# Patient Record
Sex: Female | Born: 1985 | Race: White | Hispanic: No | Marital: Married | Smoking: Never smoker
Health system: Southern US, Community
[De-identification: ages and names within clinical notes are randomized; demographics above are authoritative.]

## PROBLEM LIST (undated history)

## (undated) DIAGNOSIS — F53 Postpartum depression: Secondary | ICD-10-CM

## (undated) DIAGNOSIS — O409XX0 Polyhydramnios, unspecified trimester, not applicable or unspecified: Secondary | ICD-10-CM

## (undated) DIAGNOSIS — O24419 Gestational diabetes mellitus in pregnancy, unspecified control: Secondary | ICD-10-CM

## (undated) DIAGNOSIS — F909 Attention-deficit hyperactivity disorder, unspecified type: Secondary | ICD-10-CM

## (undated) DIAGNOSIS — D649 Anemia, unspecified: Secondary | ICD-10-CM

## (undated) DIAGNOSIS — R87629 Unspecified abnormal cytological findings in specimens from vagina: Secondary | ICD-10-CM

## (undated) HISTORY — DX: Postpartum depression: F53.0

## (undated) HISTORY — PX: WISDOM TOOTH EXTRACTION: SHX21

## (undated) HISTORY — DX: Unspecified abnormal cytological findings in specimens from vagina: R87.629

## (undated) HISTORY — DX: Polyhydramnios, unspecified trimester, not applicable or unspecified: O40.9XX0

## (undated) HISTORY — DX: Attention-deficit hyperactivity disorder, unspecified type: F90.9

## (undated) HISTORY — PX: BREAST REDUCTION SURGERY: SHX8

## (undated) HISTORY — PX: BREAST SURGERY: SHX581

## (undated) HISTORY — DX: Anemia, unspecified: D64.9

## (undated) HISTORY — PX: COLPOSCOPY: SHX161

## (undated) HISTORY — PX: LEEP: SHX91

## (undated) HISTORY — DX: Gestational diabetes mellitus in pregnancy, unspecified control: O24.419

---

## 2015-05-16 DIAGNOSIS — F909 Attention-deficit hyperactivity disorder, unspecified type: Secondary | ICD-10-CM | POA: Insufficient documentation

## 2017-08-20 DIAGNOSIS — Z8279 Family history of other congenital malformations, deformations and chromosomal abnormalities: Secondary | ICD-10-CM | POA: Insufficient documentation

## 2017-08-20 DIAGNOSIS — Z9889 Other specified postprocedural states: Secondary | ICD-10-CM | POA: Insufficient documentation

## 2018-05-29 DIAGNOSIS — F419 Anxiety disorder, unspecified: Secondary | ICD-10-CM | POA: Insufficient documentation

## 2019-11-10 LAB — RESULTS CONSOLE HPV: CHL HPV: POSITIVE

## 2019-11-10 LAB — HM PAP SMEAR: HM Pap smear: ABNORMAL

## 2019-11-23 DIAGNOSIS — R87619 Unspecified abnormal cytological findings in specimens from cervix uteri: Secondary | ICD-10-CM | POA: Insufficient documentation

## 2020-07-17 LAB — HM PAP SMEAR: HM Pap smear: NORMAL

## 2020-07-17 LAB — RESULTS CONSOLE HPV: CHL HPV: NEGATIVE

## 2021-03-28 DIAGNOSIS — Z8616 Personal history of COVID-19: Secondary | ICD-10-CM

## 2021-03-28 HISTORY — DX: Personal history of COVID-19: Z86.16

## 2021-05-08 ENCOUNTER — Encounter: Payer: Self-pay | Admitting: *Deleted

## 2021-05-16 ENCOUNTER — Encounter: Payer: Self-pay | Admitting: Obstetrics and Gynecology

## 2021-05-16 NOTE — Progress Notes (Signed)
Pt will come at 12 weeks for Panorama/Horizon Pt states in previous pregnancy she was "borderline" when she did 1 hr gtt and baby was measuring big so pt had to do 3 hr gtt (passed 2/3 of blood draws from 3 hr gtt) and previous doctor's office did not diagnose her with gestational diabetes

## 2021-05-17 ENCOUNTER — Encounter: Payer: Self-pay | Admitting: Obstetrics and Gynecology

## 2021-05-17 ENCOUNTER — Other Ambulatory Visit: Payer: Self-pay | Admitting: *Deleted

## 2021-05-17 ENCOUNTER — Other Ambulatory Visit: Payer: Self-pay

## 2021-05-17 ENCOUNTER — Ambulatory Visit (INDEPENDENT_AMBULATORY_CARE_PROVIDER_SITE_OTHER): Payer: 59 | Admitting: Obstetrics and Gynecology

## 2021-05-17 ENCOUNTER — Ambulatory Visit (INDEPENDENT_AMBULATORY_CARE_PROVIDER_SITE_OTHER): Payer: 59

## 2021-05-17 VITALS — BP 110/71 | HR 94 | Ht 66.0 in | Wt 170.0 lb

## 2021-05-17 DIAGNOSIS — O3680X0 Pregnancy with inconclusive fetal viability, not applicable or unspecified: Secondary | ICD-10-CM

## 2021-05-17 DIAGNOSIS — Z3A00 Weeks of gestation of pregnancy not specified: Secondary | ICD-10-CM | POA: Diagnosis not present

## 2021-05-17 DIAGNOSIS — R87619 Unspecified abnormal cytological findings in specimens from cervix uteri: Secondary | ICD-10-CM | POA: Diagnosis not present

## 2021-05-17 DIAGNOSIS — O0990 Supervision of high risk pregnancy, unspecified, unspecified trimester: Secondary | ICD-10-CM | POA: Insufficient documentation

## 2021-05-17 DIAGNOSIS — O409XX0 Polyhydramnios, unspecified trimester, not applicable or unspecified: Secondary | ICD-10-CM | POA: Insufficient documentation

## 2021-05-17 DIAGNOSIS — Z3687 Encounter for antenatal screening for uncertain dates: Secondary | ICD-10-CM

## 2021-05-17 DIAGNOSIS — O09529 Supervision of elderly multigravida, unspecified trimester: Secondary | ICD-10-CM | POA: Insufficient documentation

## 2021-05-17 LAB — ANTIBODY SCREEN: Antibody Screen: NOT DETECTED

## 2021-05-17 LAB — HCG, QUANTITATIVE, PREGNANCY: HCG, Total, QN: 20745 m[IU]/mL

## 2021-05-17 LAB — RH TYPE

## 2021-05-17 NOTE — Progress Notes (Signed)
   GYNECOLOGY OFFICE VISIT NOTE  History:   Taylor Yu is a 35 y.o. G2P1001 here today for initially a new OB visit but bedside US shows potentially only a small gestational sac. I was at bedside while Mariel Aloe, RN performed the bedside US.   She has no symptoms to suggest SAB. She denies any abnormal vaginal discharge, bleeding, pelvic pain or other concerns.     Past Medical History:  Diagnosis Date   ADHD    Gestational diabetes    Polyhydramnios affecting pregnancy    Vaginal Pap smear, abnormal     Past Surgical History:  Procedure Laterality Date   BREAST REDUCTION SURGERY     CESAREAN SECTION     COLPOSCOPY     LEEP     WISDOM TOOTH EXTRACTION      The following portions of the patient's history were reviewed and updated as appropriate: allergies, current medications, past family history, past medical history, past social history, past surgical history and problem list.   Review of Systems:  Pertinent items noted in HPI and remainder of comprehensive ROS otherwise negative.  Physical Exam:  BP 110/71   Pulse 94   Ht 5\' 6"  (1.676 m)   Wt 170 lb (77.1 kg)   LMP 03/05/2021   BMI 27.44 kg/m  CONSTITUTIONAL: Well-developed, well-nourished female in no acute distress.  HEENT:  Normocephalic, atraumatic. External right and left ear normal. No scleral icterus.   SKIN: No rash noted. Not diaphoretic. No erythema. No pallor. MUSCULOSKELETAL: Normal range of motion. No edema noted. NEUROLOGIC: Alert and oriented to person, place, and time. Normal muscle tone coordination. No cranial nerve deficit noted. PSYCHIATRIC: Normal mood and affect. Normal behavior. Normal judgment and thought content.  CARDIOVASCULAR: Normal heart rate noted RESPIRATORY: Effort and breath sounds normal, no problems with respiration noted ABDOMEN: No masses noted. No other overt distention noted.    PELVIC: Deferred  Labs and Imaging No results found for this or any previous visit (from  the past 168 hour(s)). No results found.    Assessment and Plan:    1. Pregnancy with uncertain fetal viability, single or unspecified fetus - Discussed at this time viability is unclear. Reviewed could be too early to tell vs very early SAB. We will check 03/07/2021 and serial betas and then make a management plan from that point forward.  - Discussed options if it is a miscarriage: expectant management, misoprostol and D&E.   - [place] bedside; Future - US PELVIC COMPLETE WITH TRANSVAGINAL; Future - Rh Type - Antibody screen - B-HCG Quant  2. 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 Follow up pap wnl.  - Will have her return in a month for possible new OB vs SAB f/u and repap.    Routine preventative health maintenance measures emphasized. Please refer to After Visit Summary for other counseling recommendations.   Return in about 1 month (around 06/16/2021).   - we will do annual/fu at this point vs new OB visit.   I spent 32 minutes dedicated to the care of this patient including pre-visit review of records, face to face time with the patient discussing her conditions and treatments and post visit orders.    08/16/2021, MD, FACOG Obstetrician & Gynecologist, [facility] for [facility], [facility]

## 2021-05-17 NOTE — Addendum Note (Signed)
Addended by: Kathie Dike on: 05/17/2021 10:44 AM   Modules accepted: Orders

## 2021-05-18 ENCOUNTER — Telehealth: Payer: Self-pay

## 2021-05-18 NOTE — Telephone Encounter (Signed)
Pt is to go to [facility] (due to the office being closed) on Saturday 05/19/21 for STAT BHCG blood draw per Dr.Duncan. HCG drawn on 05/17/21 and needs 48 hour follow up HCG. Pt called office to get address to MAU. Address given over the phone and sent through MyChart message. Pt is aware to go to [facility]U on 05/18/21 and that a provider will reach out to her with results and plan of care.

## 2021-05-19 ENCOUNTER — Other Ambulatory Visit: Payer: Self-pay

## 2021-05-19 ENCOUNTER — Telehealth: Payer: Self-pay | Admitting: Advanced Practice Midwife

## 2021-05-19 ENCOUNTER — Inpatient Hospital Stay (HOSPITAL_COMMUNITY)
Admission: AD | Admit: 2021-05-19 | Discharge: 2021-05-19 | Disposition: A | Discharge status: Home / Self Care | Payer: 59 | Attending: Obstetrics and Gynecology | Admitting: Obstetrics and Gynecology

## 2021-05-19 DIAGNOSIS — Z3A01 Less than 8 weeks gestation of pregnancy: Secondary | ICD-10-CM

## 2021-05-19 DIAGNOSIS — O3680X0 Pregnancy with inconclusive fetal viability, not applicable or unspecified: Secondary | ICD-10-CM | POA: Diagnosis present

## 2021-05-19 DIAGNOSIS — Z3A10 10 weeks gestation of pregnancy: Secondary | ICD-10-CM | POA: Insufficient documentation

## 2021-05-19 LAB — HCG, QUANTITATIVE, PREGNANCY: hCG, Beta Chain, Quant, S: 34917 m[IU]/mL — ABNORMAL HIGH (ref <5)

## 2021-05-19 NOTE — MAU Note (Signed)
Pt reports to mau for follow up lab work.  Pt denies vag bleeding or pain at this time.

## 2021-05-19 NOTE — MAU Provider Note (Signed)
History    Event Date/Time   First Provider Initiated Contact with Patient 05/19/21 1135      Chief Complaint:  Follow-up   Taylor Yu is  35 y.o. G2P1001 Patient's last menstrual period was 03/05/2021.[name] Patient is here for follow up of quantitative HCG and ongoing surveillance of pregnancy status.  She is [redacted]w[redacted]d weeks gestation by LMP.  Was seen at [facility] 05/17/21 for NOB. Viability US showed no GS or fetal pole. On review of images with Dr. Donavan Foil there is possibly a collapsed gestational sac. Denies any Hx vaginal bleeding. Slight low abd cramping.  Since her last visit, the patient is without new complaint.     ROS Abdominal Pain: Denies Vaginal bleeding: none.   Passage of clots or tissue: None Dizziness: Denies  Rh +   Her previous Quantitative HCG values are: Results for Taylor Yu, SAHM" (MRN [id number]) as of 05/19/2021 13:24  Ref. Range 05/17/2021 11:21  HCG, Total, QN Latest Units: mIU/mL 20,745    Physical Exam   Patient Vitals for the past 24 hrs:  BP Temp Temp src Pulse Resp SpO2  05/19/21 1127 115/73 98.3 F (36.8 C) Oral 93 15 99 %   Constitutional: Well-nourished female in no apparent distress. No pallor Neuro: Alert and oriented 4 Cardiovascular: Normal rate Respiratory: Normal effort and rate Abdomen: Soft, nontender Gynecological Exam: examination not indicated  Labs: Results for orders placed or performed during the hospital encounter of 05/19/21 (from the past 24 hour(s))  hCG, quantitative, pregnancy   Collection Time: 05/19/21 11:29 AM  Result Value Ref Range   hCG, Beta Chain, Quant, S 34,917 (H) <5 mIU/mL    Ultrasound Studies:   US OB LESS THAN 14 WEEKS WITH OB TRANSVAGINAL  Result Date: 05/17/2021 CLINICAL DATA:  Fetal viability. EXAM: OBSTETRIC <14 WK [place] AND TRANSVAGINAL OB US TECHNIQUE: Both transabdominal and transvaginal ultrasound examinations were performed for complete evaluation of the gestation as well as the maternal  uterus, adnexal regions, and pelvic cul-de-sac. Transvaginal technique was performed to assess early pregnancy. COMPARISON:  None. FINDINGS: Intrauterine gestational sac: None Yolk sac:  Not Visualized. Embryo:  Not Visualized. Cardiac Activity: Not Visualized. Subchorionic hemorrhage:  None visualized. Maternal uterus/adnexae: Probable corpus luteum cyst seen in right ovary. Left ovary is unremarkable. Complex echogenic material is noted within the endometrial space which may represent decidual reaction no free fluid is noted. IMPRESSION: No intrauterine gestational sac, yolk sac, fetal pole, or cardiac activity visualized. Differential considerations include intrauterine gestation too early to be sonographically visualized, spontaneous abortion, or ectopic pregnancy. Consider follow-up ultrasound in 14 days and serial quantitative beta HCG follow-up. Electronically Signed   By: Lupita Raider M.D.   On: 05/17/2021 11:55    MAU course/MDM: Quantitative hCG ordered  Pregnancy of unknown anatomic location with low-normal rise (66%) in Quant and hemodynamically stable.  Although ultrasound report states no gestational sac, there is complex echogenic material within the endometrial space which may be a collapsed gestational sac.  Although hCG levels are far above the discriminatory zone, the pt does actually have something in her uterus making ectopic less likely.   Assessment: Pregnancy of unknown anatomic location with normal rise in Quant but high suspicion for failed pregnancy due to high quants without fetal pole on ultrasound.  Plan: Discharge home in stable condition. SAB and ectopic precautions 48-hour quant not indicated per Dr. Donavan Foil.  Repeat ultrasound 2 weeks after first ultrasound.  In basket message sent to [id number]. Allergies as  of 05/19/2021       Reactions   Meperidine Itching, Rash        Medication List     ASK your doctor about these medications     amphetamine-dextroamphetamine 30 MG 24 hr capsule Commonly known as: ADDERALL XR Take by mouth.   amphetamine-dextroamphetamine 10 MG tablet Commonly known as: ADDERALL Take by mouth.   FLUoxetine 20 MG capsule Commonly known as: PROZAC Take 1 capsule by mouth daily.        Hazel Dell, [place], CNM 05/19/2021, 1:22 PM  2/3

## 2021-05-19 NOTE — Telephone Encounter (Signed)
~  66 % rise in hCG. Possible collapsed GS on [place] 05/17/21. Per Dr. Donavan Foil will repeat US 05/30/21. SAB and ectopic precautions.

## 2021-05-30 ENCOUNTER — Ambulatory Visit (INDEPENDENT_AMBULATORY_CARE_PROVIDER_SITE_OTHER): Payer: 59

## 2021-05-30 ENCOUNTER — Telehealth: Payer: Self-pay | Admitting: Advanced Practice Midwife

## 2021-05-30 ENCOUNTER — Other Ambulatory Visit: Payer: Self-pay | Admitting: Advanced Practice Midwife

## 2021-05-30 ENCOUNTER — Other Ambulatory Visit: Payer: Self-pay

## 2021-05-30 DIAGNOSIS — Z3A00 Weeks of gestation of pregnancy not specified: Secondary | ICD-10-CM | POA: Diagnosis not present

## 2021-05-30 DIAGNOSIS — Z3687 Encounter for antenatal screening for uncertain dates: Secondary | ICD-10-CM | POA: Diagnosis not present

## 2021-05-30 DIAGNOSIS — O3680X0 Pregnancy with inconclusive fetal viability, not applicable or unspecified: Secondary | ICD-10-CM | POA: Diagnosis not present

## 2021-05-30 NOTE — Telephone Encounter (Signed)
Called pt to discuss [place] results. This is F/U from [place] 05/17/21 showing Pregnancy of unknown anatomic location with low-normal rise (66%) in Quant. Although ultrasound report states no gestational sac, there was complex echogenic material within the endometrial space which appeared to be possible collapsed gestational sac.  Although hCG levels are far above the discriminatory zone, the pt appeared to actually have something in her uterus making ectopic less likely. Consulted w/ Dr. Donavan Foil who agreed and recommended F/U US 05/30/21.   Quants  05/17/21: 37,169 05/19/21: 67,893  Today's [place] is concerning for possible molar pregnancy.   Maternal uterus/adnexae: No adnexal mass or free fluid. Heterogeneous appearance of the endometrium with irregular cystic spaces throughout the endometrium. Endometrial thickness approximately 13 mm.   IMPRESSION: No intrauterine gestational sac. Endometrium is heterogeneous with multiple irregular cystic spaces throughout the endometrium. This does not appear masslike and likely reflects hemorrhage. However, recommend follow-up quantitative beta HCG and ultrasounds to exclude possibility of retained products or gestational trophoblastic neoplasia.  Consulted w/ Dr. Alysia Penna. Pt needs D/C and to follow quants all the way down to <5.  Discussed concerns for molar pregnancy w/ pt and need to D/C for confirmation of Dx and Tx. Emphasized extreme importance of following quants all the way down because of small but serious risk of malignant changes. Addressed pt's questions. In-basket message sent to [facility] to scheduled D&C ASAP w/ any surgeon. Pt asked to F/U with Dorathy Kinsman, CNM since we have talked to most. Will need to determine F/U based on results. I am happy to F/U with her at the office after consultation w/ attending. Offered pt consultation appt w/ physician prior to [facility] but she declined.   Katrinka Blazing, [place], CNM 05/30/2021 2:08 PM

## 2021-05-31 ENCOUNTER — Other Ambulatory Visit (HOSPITAL_COMMUNITY): Payer: Self-pay | Admitting: Obstetrics and Gynecology

## 2021-05-31 ENCOUNTER — Other Ambulatory Visit: Payer: Self-pay

## 2021-05-31 ENCOUNTER — Encounter (HOSPITAL_BASED_OUTPATIENT_CLINIC_OR_DEPARTMENT_OTHER): Payer: Self-pay | Admitting: Obstetrics and Gynecology

## 2021-05-31 ENCOUNTER — Telehealth: Payer: Self-pay

## 2021-05-31 ENCOUNTER — Other Ambulatory Visit: Payer: Self-pay | Admitting: Obstetrics and Gynecology

## 2021-05-31 DIAGNOSIS — O020 Blighted ovum and nonhydatidiform mole: Secondary | ICD-10-CM

## 2021-05-31 NOTE — H&P (Signed)
Taylor Yu is an 35 y.o. female G2P1001 with misses AB vs molar pregnancy @ 10 weeks. U/S yesterday no IUP or gestational sac, endometrium thicken at 13 mm with cystic spaces.  H/O C section H/O LEEP  O +  Menstrual History: Menarche age: 10 Patient's last menstrual period was 03/05/2021.    Past Medical History:  Diagnosis Date   ADHD    Gestational diabetes    History of COVID-19 03/28/2021   Polyhydramnios affecting pregnancy    Vaginal Pap smear, abnormal     Past Surgical History:  Procedure Laterality Date   BREAST REDUCTION SURGERY     CESAREAN SECTION     COLPOSCOPY     LEEP     WISDOM TOOTH EXTRACTION      Family History  Problem Relation Age of Onset   Multiple sclerosis Mother    ADD / ADHD Sister    Heart defect Brother    Cancer Maternal Aunt    Ovarian cancer Maternal Aunt    Cancer Maternal Grandmother    Alzheimer's disease Paternal Grandmother    Stroke Paternal Grandmother     Social History:  reports that she has never smoked. She has never used smokeless tobacco. She reports that she does not currently use alcohol. She reports that she does not use drugs.  Allergies:  Allergies  Allergen Reactions   Meperidine Itching and Rash    No medications prior to admission.    Review of Systems  Constitutional: Negative.   Respiratory: Negative.    Cardiovascular: Negative.   Gastrointestinal: Negative.   Genitourinary: Negative.    Height 5\' 6"  (1.676 m), weight 77.1 kg, last menstrual period 03/05/2021. Physical Exam Constitutional:      Appearance: Normal appearance.  Cardiovascular:     Rate and Rhythm: Normal rate and regular rhythm.  Pulmonary:     Effort: Pulmonary effort is normal.     Breath sounds: Normal breath sounds.  Abdominal:     General: Bowel sounds are normal.     Palpations: Abdomen is soft.  Genitourinary:    Comments: Deferred to OR Neurological:     Mental Status: She is alert.    No results found for  this or any previous visit (from the past 24 hour(s)).  03/07/2021 OB LESS THAN 14 WEEKS WITH OB TRANSVAGINAL  Result Date: 05/30/2021 CLINICAL DATA:  Pregnancy of unknown anatomic location EXAM: OBSTETRIC <14 WK 06/01/2021 AND TRANSVAGINAL OB US TECHNIQUE: Both transabdominal and transvaginal ultrasound examinations were performed for complete evaluation of the gestation as well as the maternal uterus, adnexal regions, and pelvic cul-de-sac. Transvaginal technique was performed to assess early pregnancy. COMPARISON:  None. FINDINGS: Intrauterine gestational sac: None Yolk sac:  Not Visualized. Embryo: Cardiac Activity: Not Visualized. Heart Rate:   bpm MSD:   mm    w     d CRL:    mm    w    d                  [place] EDC: Subchorionic hemorrhage:  None visualized. Maternal uterus/adnexae: No adnexal mass or free fluid. Heterogeneous appearance of the endometrium with irregular cystic spaces throughout the endometrium. Endometrial thickness approximately 13 mm. IMPRESSION: No intrauterine gestational sac. Endometrium is heterogeneous with multiple irregular cystic spaces throughout the endometrium. This does not appear masslike and likely reflects hemorrhage. However, recommend follow-up quantitative beta HCG and ultrasounds to exclude possibility of retained products or gestational trophoblastic neoplasia. Electronically Signed  By: Charlett Nose M.D.   On: 05/30/2021 10:54    Assessment/Plan: Probable missed AB, low risk of molar pregnancy  Surgerical management has been recommended to the pt. Pt desires as well. R/B/Post op care reviewed with pt. Pt has verbalized understanding and agrees to proceed.   Hermina Staggers 05/31/2021, 11:20 AM

## 2021-05-31 NOTE — Telephone Encounter (Signed)
Called patient, no answer, left message with surgery date/time & preop instructions

## 2021-05-31 NOTE — Anesthesia Preprocedure Evaluation (Addendum)
Anesthesia Evaluation  Patient identified by MRN, date of birth, ID band Patient awake    Reviewed: Allergy & Precautions, NPO status , Patient's Chart, lab work & pertinent test results  Airway Mallampati: II  TM Distance: >3 FB Neck ROM: Full    Dental  (+) Teeth Intact   Pulmonary neg pulmonary ROS,    Pulmonary exam normal        Cardiovascular negative cardio ROS   Rhythm:Regular Rate:Normal     Neuro/Psych Anxiety negative neurological ROS     GI/Hepatic negative GI ROS, Neg liver ROS,   Endo/Other  diabetes  Renal/GU negative Renal ROS  negative genitourinary   Musculoskeletal negative musculoskeletal ROS (+)   Abdominal (+)  Abdomen: soft.    Peds  (+) ADHD Hematology negative hematology ROS (+)   Anesthesia Other Findings   Reproductive/Obstetrics Molar pregnancy                             Anesthesia Physical Anesthesia Plan  ASA: 2  Anesthesia Plan: General   Post-op Pain Management:    Induction: Intravenous  PONV Risk Score and Plan: 3 and Ondansetron, Dexamethasone, Midazolam and Treatment may vary due to age or medical condition  Airway Management Planned: Mask and LMA  Additional Equipment: None  Intra-op Plan:   Post-operative Plan: Extubation in OR  Informed Consent: I have reviewed the patients History and Physical, chart, labs and discussed the procedure including the risks, benefits and alternatives for the proposed anesthesia with the patient or authorized representative who has indicated his/her understanding and acceptance.     Dental advisory given  Plan Discussed with: CRNA  Anesthesia Plan Comments: (Lab Results      Component                Value               Date                      WBC                      6.6                 06/01/2021                HGB                      12.8                06/01/2021                HCT                       37.8                06/01/2021                MCV                      91.1                06/01/2021                PLT                      269  06/01/2021           )       Anesthesia Quick Evaluation

## 2021-06-01 ENCOUNTER — Ambulatory Visit (HOSPITAL_BASED_OUTPATIENT_CLINIC_OR_DEPARTMENT_OTHER)
Admission: RE | Admit: 2021-06-01 | Discharge: 2021-06-01 | Disposition: A | Discharge status: Home / Self Care | Payer: 59 | Attending: Obstetrics and Gynecology | Admitting: Obstetrics and Gynecology

## 2021-06-01 ENCOUNTER — Other Ambulatory Visit: Payer: Self-pay

## 2021-06-01 ENCOUNTER — Ambulatory Visit (HOSPITAL_BASED_OUTPATIENT_CLINIC_OR_DEPARTMENT_OTHER): Payer: 59 | Admitting: Anesthesiology

## 2021-06-01 ENCOUNTER — Encounter (HOSPITAL_BASED_OUTPATIENT_CLINIC_OR_DEPARTMENT_OTHER)
Admission: RE | Disposition: A | Discharge status: Home / Self Care | Payer: Self-pay | Source: Home / Self Care | Attending: Obstetrics and Gynecology

## 2021-06-01 ENCOUNTER — Ambulatory Visit (HOSPITAL_COMMUNITY)
Admission: RE | Admit: 2021-06-01 | Discharge: 2021-06-01 | Disposition: A | Discharge status: Home / Self Care | Payer: 59 | Source: Ambulatory Visit | Attending: Obstetrics and Gynecology | Admitting: Obstetrics and Gynecology

## 2021-06-01 ENCOUNTER — Encounter (HOSPITAL_BASED_OUTPATIENT_CLINIC_OR_DEPARTMENT_OTHER): Payer: Self-pay | Admitting: Obstetrics and Gynecology

## 2021-06-01 DIAGNOSIS — Z885 Allergy status to narcotic agent status: Secondary | ICD-10-CM | POA: Diagnosis not present

## 2021-06-01 DIAGNOSIS — O021 Missed abortion: Secondary | ICD-10-CM

## 2021-06-01 DIAGNOSIS — O020 Blighted ovum and nonhydatidiform mole: Secondary | ICD-10-CM | POA: Insufficient documentation

## 2021-06-01 DIAGNOSIS — Z8616 Personal history of COVID-19: Secondary | ICD-10-CM | POA: Insufficient documentation

## 2021-06-01 HISTORY — PX: OPERATIVE ULTRASOUND: SHX5996

## 2021-06-01 HISTORY — PX: DILATION AND EVACUATION: SHX1459

## 2021-06-01 LAB — CBC
HCT: 37.8 % (ref 36.0–46.0)
Hemoglobin: 12.8 g/dL (ref 12.0–15.0)
MCH: 30.8 pg (ref 26.0–34.0)
MCHC: 33.9 g/dL (ref 30.0–36.0)
MCV: 91.1 fL (ref 80.0–100.0)
Platelets: 269 10*3/uL (ref 150–400)
RBC: 4.15 MIL/uL (ref 3.87–5.11)
RDW: 12.1 % (ref 11.5–15.5)
WBC: 6.6 10*3/uL (ref 4.0–10.5)
nRBC: 0 % (ref 0.0–0.2)

## 2021-06-01 LAB — TYPE AND SCREEN
ABO/RH(D): O POS
Antibody Screen: NEGATIVE

## 2021-06-01 SURGERY — DILATION AND EVACUATION, UTERUS
Anesthesia: General

## 2021-06-01 MED ORDER — LIDOCAINE 2% (20 MG/ML) 5 ML SYRINGE
INTRAMUSCULAR | Status: AC
  Filled 2021-06-01: qty 5

## 2021-06-01 MED ORDER — OXYCODONE HCL 5 MG PO TABS: 5.0000 mg | ORAL_TABLET | Freq: Four times a day (QID) | ORAL | 0 refills | Status: DC | PRN

## 2021-06-01 MED ORDER — ONDANSETRON HCL 4 MG/2ML IJ SOLN
INTRAMUSCULAR | Status: AC
  Filled 2021-06-01: qty 2

## 2021-06-01 MED ORDER — LACTATED RINGERS IV SOLN: INTRAVENOUS | Status: DC

## 2021-06-01 MED ORDER — PROPOFOL 10 MG/ML IV BOLUS
INTRAVENOUS | Status: DC | PRN
  Administered 2021-06-01: 160 mg via INTRAVENOUS

## 2021-06-01 MED ORDER — LIDOCAINE HCL (CARDIAC) PF 100 MG/5ML IV SOSY
PREFILLED_SYRINGE | INTRAVENOUS | Status: DC | PRN
  Administered 2021-06-01: 60 mg via INTRAVENOUS

## 2021-06-01 MED ORDER — KETOROLAC TROMETHAMINE 30 MG/ML IJ SOLN
INTRAMUSCULAR | Status: DC | PRN
  Administered 2021-06-01: 30 mg via INTRAVENOUS

## 2021-06-01 MED ORDER — ONDANSETRON HCL 4 MG/2ML IJ SOLN
INTRAMUSCULAR | Status: DC | PRN
  Administered 2021-06-01: 4 mg via INTRAVENOUS

## 2021-06-01 MED ORDER — SODIUM CHLORIDE 0.9 % IV SOLN
INTRAVENOUS | Status: AC
  Filled 2021-06-01 (×2): qty 100

## 2021-06-01 MED ORDER — FENTANYL CITRATE (PF) 100 MCG/2ML IJ SOLN
INTRAMUSCULAR | Status: AC
  Filled 2021-06-01: qty 2

## 2021-06-01 MED ORDER — MISOPROSTOL 200 MCG PO TABS
ORAL_TABLET | ORAL | Status: DC | PRN
  Administered 2021-06-01: 1000 ug via RECTAL

## 2021-06-01 MED ORDER — KETOROLAC TROMETHAMINE 15 MG/ML IJ SOLN
15.0000 mg | INTRAMUSCULAR | Status: AC
  Administered 2021-06-01: 15 mg via INTRAVENOUS

## 2021-06-01 MED ORDER — ACETAMINOPHEN 500 MG PO TABS
ORAL_TABLET | ORAL | Status: AC
  Filled 2021-06-01: qty 2

## 2021-06-01 MED ORDER — DEXAMETHASONE SODIUM PHOSPHATE 10 MG/ML IJ SOLN
INTRAMUSCULAR | Status: AC
  Filled 2021-06-01: qty 1

## 2021-06-01 MED ORDER — IBUPROFEN 800 MG PO TABS: 800.0000 mg | ORAL_TABLET | Freq: Three times a day (TID) | ORAL | 0 refills | Status: DC | PRN

## 2021-06-01 MED ORDER — FENTANYL CITRATE (PF) 100 MCG/2ML IJ SOLN
25.0000 ug | INTRAMUSCULAR | Status: DC | PRN
  Administered 2021-06-01 (×2): 25 ug via INTRAVENOUS

## 2021-06-01 MED ORDER — FENTANYL CITRATE (PF) 100 MCG/2ML IJ SOLN
INTRAMUSCULAR | Status: DC | PRN
  Administered 2021-06-01: 25 ug via INTRAVENOUS
  Administered 2021-06-01: 50 ug via INTRAVENOUS
  Administered 2021-06-01: 25 ug via INTRAVENOUS

## 2021-06-01 MED ORDER — MIDAZOLAM HCL 5 MG/5ML IJ SOLN
INTRAMUSCULAR | Status: DC | PRN
  Administered 2021-06-01: 2 mg via INTRAVENOUS

## 2021-06-01 MED ORDER — PROMETHAZINE HCL 25 MG/ML IJ SOLN: 6.2500 mg | INTRAMUSCULAR | Status: DC | PRN

## 2021-06-01 MED ORDER — KETOROLAC TROMETHAMINE 15 MG/ML IJ SOLN
INTRAMUSCULAR | Status: AC
  Filled 2021-06-01: qty 1

## 2021-06-01 MED ORDER — DEXAMETHASONE SODIUM PHOSPHATE 4 MG/ML IJ SOLN
INTRAMUSCULAR | Status: DC | PRN
  Administered 2021-06-01: 10 mg via INTRAVENOUS

## 2021-06-01 MED ORDER — MIDAZOLAM HCL 2 MG/2ML IJ SOLN
INTRAMUSCULAR | Status: AC
  Filled 2021-06-01: qty 2

## 2021-06-01 MED ORDER — SOD CITRATE-CITRIC ACID 500-334 MG/5ML PO SOLN
ORAL | Status: AC
  Filled 2021-06-01: qty 30

## 2021-06-01 MED ORDER — DOXYCYCLINE HYCLATE 100 MG IV SOLR
200.0000 mg | INTRAVENOUS | Status: AC
Start: 2021-06-01 — End: 2021-06-01
  Administered 2021-06-01: 200 mg via INTRAVENOUS
  Filled 2021-06-01: qty 200

## 2021-06-01 MED ORDER — OXYCODONE HCL 5 MG/5ML PO SOLN
5.0000 mg | Freq: Once | ORAL | Status: DC | PRN
Start: 2021-06-01 — End: 2021-06-01

## 2021-06-01 MED ORDER — SOD CITRATE-CITRIC ACID 500-334 MG/5ML PO SOLN
30.0000 mL | ORAL | Status: AC
  Administered 2021-06-01: 30 mL via ORAL

## 2021-06-01 MED ORDER — MISOPROSTOL 200 MCG PO TABS
ORAL_TABLET | ORAL | Status: AC
  Filled 2021-06-01: qty 1

## 2021-06-01 MED ORDER — OXYCODONE HCL 5 MG PO TABS: 5.0000 mg | ORAL_TABLET | Freq: Once | ORAL | Status: DC | PRN

## 2021-06-01 MED ORDER — BUPIVACAINE HCL 0.25 % IJ SOLN
INTRAMUSCULAR | Status: DC | PRN
  Administered 2021-06-01: 8 mL

## 2021-06-01 MED ORDER — ACETAMINOPHEN 500 MG PO TABS
1000.0000 mg | ORAL_TABLET | ORAL | Status: AC
  Administered 2021-06-01: 1000 mg via ORAL

## 2021-06-01 MED ORDER — PROPOFOL 500 MG/50ML IV EMUL
INTRAVENOUS | Status: AC
  Filled 2021-06-01: qty 50

## 2021-06-01 MED ORDER — POVIDONE-IODINE 10 % EX SWAB: 2.0000 "application " | Freq: Once | CUTANEOUS | Status: DC

## 2021-06-01 MED ORDER — BUPIVACAINE HCL (PF) 0.25 % IJ SOLN
INTRAMUSCULAR | Status: AC
  Filled 2021-06-01: qty 30

## 2021-06-01 SURGICAL SUPPLY — 18 items
CATH ROBINSON RED A/P 16FR (CATHETERS) ×2 IMPLANT
GLOVE SURG ENC MOIS LTX SZ7.5 (GLOVE) ×2 IMPLANT
GLOVE SURG UNDER POLY LF SZ7 (GLOVE) ×2 IMPLANT
GOWN STRL REUS W/ TWL LRG LVL3 (GOWN DISPOSABLE) ×1 IMPLANT
GOWN STRL REUS W/ TWL XL LVL3 (GOWN DISPOSABLE) ×1 IMPLANT
GOWN STRL REUS W/TWL LRG LVL3 (GOWN DISPOSABLE) ×2
GOWN STRL REUS W/TWL XL LVL3 (GOWN DISPOSABLE) ×2
HIBICLENS CHG 4% 4OZ BTL (MISCELLANEOUS) ×2 IMPLANT
KIT BERKELEY 1ST TRI 3/8 NO TR (MISCELLANEOUS) ×2 IMPLANT
KIT BERKELEY 1ST TRIMESTER 3/8 (MISCELLANEOUS) ×2 IMPLANT
NS IRRIG 1000ML POUR BTL (IV SOLUTION) ×2 IMPLANT
PACK VAGINAL MINOR WOMEN LF (CUSTOM PROCEDURE TRAY) ×2 IMPLANT
PAD OB MATERNITY 4.3X12.25 (PERSONAL CARE ITEMS) ×2 IMPLANT
PAD PREP 24X48 CUFFED NSTRL (MISCELLANEOUS) ×2 IMPLANT
SET BERKELEY SUCTION TUBING (SUCTIONS) ×2 IMPLANT
TOWEL GREEN STERILE FF (TOWEL DISPOSABLE) ×2 IMPLANT
TRAY FOLEY W/BAG SLVR 14FR LF (SET/KITS/TRAYS/PACK) ×2 IMPLANT
VACURETTE 8 RIGID CVD (CANNULA) ×2 IMPLANT

## 2021-06-01 NOTE — Anesthesia Procedure Notes (Signed)
Procedure Name: LMA Insertion Date/Time: 06/01/2021 10:52 AM Performed by: Burna Cash, CRNA Pre-anesthesia Checklist: Patient identified, Emergency Drugs available, Suction available and Patient being monitored Patient Re-evaluated:Patient Re-evaluated prior to induction Oxygen Delivery Method: Circle system utilized Preoxygenation: Pre-oxygenation with 100% oxygen Induction Type: IV induction Ventilation: Mask ventilation without difficulty LMA: LMA inserted LMA Size: 4.0 Number of attempts: 1 Airway Equipment and Method: Bite block Placement Confirmation: positive ETCO2 Tube secured with: Tape Dental Injury: Teeth and Oropharynx as per pre-operative assessment

## 2021-06-01 NOTE — Discharge Instructions (Signed)
May take Tylenol or Ibuprofen next at 3pm today if needed.   Post Anesthesia Home Care Instructions  Activity: Get plenty of rest for the remainder of the day. A responsible individual must stay with you for 24 hours following the procedure.  For the next 24 hours, DO NOT: -Drive a car -Advertising copywriter -Drink alcoholic beverages -Take any medication unless instructed by your physician -Make any legal decisions or sign important papers.  Meals: Start with liquid foods such as gelatin or soup. Progress to regular foods as tolerated. Avoid greasy, spicy, heavy foods. If nausea and/or vomiting occur, drink only clear liquids until the nausea and/or vomiting subsides. Call your physician if vomiting continues.  Special Instructions/Symptoms: Your throat may feel dry or sore from the anesthesia or the breathing tube placed in your throat during surgery. If this causes discomfort, gargle with warm salt water. The discomfort should disappear within 24 hours.  If you had a scopolamine patch placed behind your ear for the management of post- operative nausea and/or vomiting:  1. The medication in the patch is effective for 72 hours, after which it should be removed.  Wrap patch in a tissue and discard in the trash. Wash hands thoroughly with soap and water. 2. You may remove the patch earlier than 72 hours if you experience unpleasant side effects which may include dry mouth, dizziness or visual disturbances. 3. Avoid touching the patch. Wash your hands with soap and water after contact with the patch.

## 2021-06-01 NOTE — Anesthesia Postprocedure Evaluation (Signed)
Anesthesia Post Note  Patient: Taylor Yu  Procedure(s) Performed: DILATATION AND EVACUATION OPERATIVE ULTRASOUND     Patient location during evaluation: PACU Anesthesia Type: General Level of consciousness: awake and alert Pain management: pain level controlled Vital Signs Assessment: post-procedure vital signs reviewed and stable Respiratory status: spontaneous breathing, nonlabored ventilation, respiratory function stable and patient connected to nasal cannula oxygen Cardiovascular status: blood pressure returned to baseline and stable Postop Assessment: no apparent nausea or vomiting Anesthetic complications: no   No notable events documented.  Last Vitals:  Vitals:   06/01/21 1245 06/01/21 1315  BP: 101/71 108/78  Pulse: 70 68  Resp: 13 14  Temp:  37.1 C  SpO2: 97% 97%    Last Pain:  Vitals:   06/01/21 1254  TempSrc:   PainSc: 0-No pain                 Earl Lites P Juston Goheen

## 2021-06-01 NOTE — Interval H&P Note (Signed)
History and Physical Interval Note:  06/01/2021 10:39 AM  Taylor Yu  has presented today for surgery, with the diagnosis of Molar Pregncacy.  The various methods of treatment have been discussed with the patient and family. After consideration of risks, benefits and other options for treatment, the patient has consented to  Procedure(s): DILATATION AND EVACUATION (N/A) OPERATIVE ULTRASOUND (N/A) as a surgical intervention.  The patient's history has been reviewed, patient examined, no change in status, stable for surgery.  I have reviewed the patient's chart and labs.  Questions were answered to the patient's satisfaction.     Hermina Staggers

## 2021-06-01 NOTE — Op Note (Signed)
Taylor Yu PROCEDURE DATE: 9/16/202210  PREOPERATIVE DIAGNOSIS: 10 week missed abortion POSTOPERATIVE DIAGNOSIS: The same PROCEDURE:     Dilation and Evacuation under U/S guidance SURGEON:   Nettie Elm, MD  INDICATIONS: 35 y.o. G2P1001 with MAB at [redacted] weeks gestation, needing surgical completion.  Risks of surgery were discussed with the patient including but not limited to: bleeding which may require transfusion; infection which may require antibiotics; injury to uterus or surrounding organs; need for additional procedures including laparotomy or laparoscopy; possibility of intrauterine scarring which may impair future fertility; and other postoperative/anesthesia complications. Written informed consent was obtained.    FINDINGS:  A 10 week size uterus, moderate amounts of products of conception, specimen sent to pathology plus second specimen from sharp curratge of endometrial tissue  ANESTHESIA:    Monitored intravenous sedation, paracervical block. INTRAVENOUS FLUIDS:  As recorded ESTIMATED BLOOD LOSS:  Less than 50 SPECIMENS:  Products of conception sent to pathology COMPLICATIONS:  None immediate.  PROCEDURE DETAILS:  The patient received intravenous Doxycycline while in the preoperative area.  She was then taken to the operating room where monitored intravenous sedation was administered and was found to be adequate.  After an adequate timeout was performed, she was placed in the dorsal lithotomy position and examined; then prepped and draped in the sterile manner.   Her bladder was catheterized for an unmeasured amount of clear, yellow urine. A vaginal speculum was then placed in the patient's vagina and a single tooth tenaculum was applied to the anterior lip of the cervix.  A paracervical block using 30 ml of 0.5% Marcaine was administered. The cervix was gently dilated to accommodate a 8 mm suction curette that was gently advanced to the uterine fundus.  The suction device was then  activated and curette slowly rotated to clear the uterus of products of conception.  A sharp curettage was then performed to confirm complete emptying of the uterus. There was minimal bleeding noted and the tenaculum removed with good hemostasis noted.  U/S demonstrated a thin endometrial stripe.  All instruments were removed from the patient's vagina. Bimanual exam found the uterus to be firm.  Sponge and instrument counts were correct times two  The patient tolerated the procedure well and was taken to the recovery area awake, and in stable condition. 1000 mcg of Cytotec was placed rectal at conclusion of the case.  The patient will be discharged to home as per PACU criteria.  Routine postoperative instructions given.  She was prescribed Percocet,  and Ibuprofen   She will follow up in the clinic on 3-4 weeks for postoperative evaluation.   Nettie Elm MD, FACOG Attending Obstetrician & Gynecologist Faculty Practice, [facility]

## 2021-06-01 NOTE — Transfer of Care (Signed)
Immediate Anesthesia Transfer of Care Note  Patient: Tawania Daponte  Procedure(s) Performed: DILATATION AND EVACUATION OPERATIVE ULTRASOUND  Patient Location: PACU  Anesthesia Type:General  Level of Consciousness: sedated  Airway & Oxygen Therapy: Patient Spontanous Breathing and Patient connected to face mask oxygen  Post-op Assessment: Report given to RN and Post -op Vital signs reviewed and stable  Post vital signs: Reviewed and stable  Last Vitals:  Vitals Value Taken Time  BP    Temp    Pulse 71 06/01/21 1131  Resp 15 06/01/21 1131  SpO2 100 % 06/01/21 1131  Vitals shown include unvalidated device data.  Last Pain:  Vitals:   06/01/21 0906  TempSrc: Oral  PainSc: 0-No pain         Complications: No notable events documented.

## 2021-06-04 ENCOUNTER — Encounter (HOSPITAL_BASED_OUTPATIENT_CLINIC_OR_DEPARTMENT_OTHER): Payer: Self-pay | Admitting: Obstetrics and Gynecology

## 2021-06-04 LAB — SURGICAL PATHOLOGY

## 2021-06-05 ENCOUNTER — Other Ambulatory Visit: Payer: Self-pay | Admitting: *Deleted

## 2021-06-05 ENCOUNTER — Other Ambulatory Visit: Payer: Self-pay | Admitting: Obstetrics and Gynecology

## 2021-06-05 ENCOUNTER — Encounter: Payer: Self-pay | Admitting: Obstetrics and Gynecology

## 2021-06-05 DIAGNOSIS — O020 Blighted ovum and nonhydatidiform mole: Secondary | ICD-10-CM

## 2021-06-05 NOTE — Telephone Encounter (Signed)
Surgery completed on 06-01-21.

## 2021-06-05 NOTE — Progress Notes (Signed)
BHCG,TSH, CMP,CXR ordered per Dr Para March I spoke with [facility] on [address] in [place] about possibly getting weekly HCG drawn at their facility since pt lives closer there than our office.  They said they did not do any outside blood draws even if a [facility] pt.  Pt is notified and she will come to [place] weekly for lab draws.

## 2021-06-06 ENCOUNTER — Encounter: Payer: Self-pay | Admitting: Advanced Practice Midwife

## 2021-06-06 ENCOUNTER — Ambulatory Visit (INDEPENDENT_AMBULATORY_CARE_PROVIDER_SITE_OTHER): Payer: 59

## 2021-06-06 ENCOUNTER — Other Ambulatory Visit: Payer: Self-pay

## 2021-06-06 ENCOUNTER — Other Ambulatory Visit: Payer: 59

## 2021-06-06 DIAGNOSIS — O020 Blighted ovum and nonhydatidiform mole: Secondary | ICD-10-CM | POA: Diagnosis not present

## 2021-06-06 LAB — COMPREHENSIVE METABOLIC PANEL
AG Ratio: 1.6 (calc) (ref 1.0–2.5)
ALT: 29 U/L (ref 6–29)
AST: 16 U/L (ref 10–30)
Albumin: 4.7 g/dL (ref 3.6–5.1)
Alkaline phosphatase (APISO): 61 U/L (ref 31–125)
BUN: 14 mg/dL (ref 7–25)
CO2: 30 mmol/L (ref 20–32)
Calcium: 9.6 mg/dL (ref 8.6–10.2)
Chloride: 102 mmol/L (ref 98–110)
Creat: 0.74 mg/dL (ref 0.50–0.97)
Globulin: 2.9 g/dL (calc) (ref 1.9–3.7)
Glucose, Bld: 92 mg/dL (ref 65–139)
Potassium: 4.3 mmol/L (ref 3.5–5.3)
Sodium: 138 mmol/L (ref 135–146)
Total Bilirubin: 0.5 mg/dL (ref 0.2–1.2)
Total Protein: 7.6 g/dL (ref 6.1–8.1)

## 2021-06-06 LAB — TSH: TSH: 16.61 mIU/L — ABNORMAL HIGH

## 2021-06-06 LAB — HCG, QUANTITATIVE, PREGNANCY: HCG, Total, QN: 2476 m[IU]/mL

## 2021-06-06 NOTE — Progress Notes (Signed)
Pt here for BHCG and CXR per VO Dr Para March

## 2021-06-07 NOTE — Addendum Note (Signed)
Addended by: Milas Hock A on: 06/07/2021 08:12 AM   Modules accepted: Orders

## 2021-06-13 ENCOUNTER — Other Ambulatory Visit (INDEPENDENT_AMBULATORY_CARE_PROVIDER_SITE_OTHER): Payer: 59

## 2021-06-13 ENCOUNTER — Other Ambulatory Visit: Payer: Self-pay

## 2021-06-13 DIAGNOSIS — O020 Blighted ovum and nonhydatidiform mole: Secondary | ICD-10-CM

## 2021-06-13 LAB — HCG, QUANTITATIVE, PREGNANCY: HCG, Total, QN: 211 m[IU]/mL

## 2021-06-13 NOTE — Progress Notes (Signed)
Pt given lab orders and sent downstairs to lab for BHCG blood draw. Pt will return next week for HCG, TSH and Free T4.

## 2021-06-18 NOTE — Progress Notes (Signed)
GYNECOLOGY OFFICE VISIT NOTE  History:   Taylor Yu is a 35 y.o. G2P1001 here today for postop follow up from D&E for molar pregnancy. She has minimal bleeding.   She does note fatigue and trouble with weight loss even before this molar pregnancy. TSH in November was normal.   She denies any abnormal vaginal discharge, bleeding, pelvic pain or other concerns.     Past Medical History:  Diagnosis Date   ADHD    Gestational diabetes    History of COVID-19 03/28/2021   Polyhydramnios affecting pregnancy    Vaginal Pap smear, abnormal     Past Surgical History:  Procedure Laterality Date   BREAST REDUCTION SURGERY     CESAREAN SECTION     COLPOSCOPY     DILATION AND EVACUATION N/A 06/01/2021   Procedure: DILATATION AND EVACUATION;  Surgeon: Hermina Staggers, MD;  Location: [facility];  Service: Gynecology;  Laterality: N/A;   LEEP     OPERATIVE ULTRASOUND N/A 06/01/2021   Procedure: OPERATIVE ULTRASOUND;  Surgeon: Hermina Staggers, MD;  Location: [facility];  Service: Gynecology;  Laterality: N/A;   WISDOM TOOTH EXTRACTION      The following portions of the patient's history were reviewed and updated as appropriate: allergies, current medications, past family history, past medical history, past social history, past surgical history and problem list.   Health Maintenance:  Normal pap and negative HRHPV on 07/2020.    Review of Systems:  Pertinent items noted in HPI and remainder of comprehensive ROS otherwise negative.  Physical Exam:  BP 112/75   Pulse 97   Ht 5\' 6"  (1.676 m)   Wt 170 lb 8 oz (77.3 kg)   LMP 03/05/2021   Breastfeeding Unknown   BMI 27.52 kg/m  CONSTITUTIONAL: Well-developed, well-nourished female in no acute distress.  HEENT:  Normocephalic, atraumatic. External right and left ear normal. No scleral icterus.  NECK: Normal range of motion, supple, no masses noted on observation SKIN: No rash noted. Not diaphoretic. No  erythema. No pallor. MUSCULOSKELETAL: Normal range of motion. No edema noted. NEUROLOGIC: Alert and oriented to person, place, and time. Normal muscle tone coordination. No cranial nerve deficit noted. PSYCHIATRIC: Normal mood and affect. Normal behavior. Normal judgment and thought content.  CARDIOVASCULAR: Normal heart rate noted RESPIRATORY: Effort and breath sounds normal, no problems with respiration noted ABDOMEN: No masses noted. No other overt distention noted.    PELVIC: Deferred  Labs and Imaging No results found for this or any previous visit (from the past 168 hour(s)). 9/3 beta 11/3 9/16: surgery 9/22: beta 2476, TSH 16.61 9/28: 211  She is rh positive  Assessment and Plan:  Diagnoses and all orders for this visit:  Molar pregnancy  Elevated TSH -     Ambulatory referral to [facility] - Plan is for HCG q1-2 weeks until 0, then 3 more checks at 1-2 week interval to ensure persistently 0. Then Q3 months for 6 months - Baseline LFTs, Cr wnl - TSH was elevated - recheck today. If still elevated, will have her f/u with PCP for management but would start her on synthroid.  - CXR was wnl - Discussed concept of AMA and historic basis.     Routine preventative health maintenance measures emphasized. Please refer to After Visit Summary for other counseling recommendations.   No follow-ups on file.    I spent  21  minutes dedicated to the care of this patient including pre-visit review  of records, face to face time with the patient discussing her conditions and treatments and post visit orders.    Milas Hock, MD, FACOG Obstetrician & Gynecologist, [facility] for [facility], [facility]

## 2021-06-21 ENCOUNTER — Encounter: Payer: Self-pay | Admitting: Obstetrics and Gynecology

## 2021-06-21 ENCOUNTER — Other Ambulatory Visit: Payer: Self-pay | Admitting: *Deleted

## 2021-06-21 ENCOUNTER — Ambulatory Visit (INDEPENDENT_AMBULATORY_CARE_PROVIDER_SITE_OTHER): Payer: 59 | Admitting: Obstetrics and Gynecology

## 2021-06-21 ENCOUNTER — Other Ambulatory Visit: Payer: Self-pay

## 2021-06-21 VITALS — BP 112/75 | HR 97 | Ht 66.0 in | Wt 170.5 lb

## 2021-06-21 DIAGNOSIS — R7989 Other specified abnormal findings of blood chemistry: Secondary | ICD-10-CM

## 2021-06-21 DIAGNOSIS — O020 Blighted ovum and nonhydatidiform mole: Secondary | ICD-10-CM

## 2021-06-21 NOTE — Progress Notes (Signed)
Patient presents for follow up for D&E due to molar pregnancy. Patient states that she is till having some intermittent brown spotting, but denies having any pain. She complains of having fatigue and not feeling rested.

## 2021-06-22 ENCOUNTER — Telehealth: Payer: Self-pay | Admitting: *Deleted

## 2021-06-22 LAB — HCG, QUANTITATIVE, PREGNANCY: HCG, Total, QN: 23 m[IU]/mL

## 2021-06-22 LAB — T4, FREE: Free T4: 1.1 ng/dL (ref 0.8–1.8)

## 2021-06-22 LAB — TSH: TSH: 4.07 mIU/L

## 2021-06-22 MED ORDER — NORETHIN ACE-ETH ESTRAD-FE 1-20 MG-MCG PO TABS: 1.0000 | ORAL_TABLET | Freq: Every day | ORAL | 2 refills | Status: DC

## 2021-06-22 NOTE — Addendum Note (Signed)
Addended by: Milas Hock A on: 06/22/2021 09:12 AM   Modules accepted: Orders

## 2021-06-22 NOTE — Telephone Encounter (Signed)
Pt notified of labs and she is OOT next week.  She will return for labs on 07/02/21.[name]

## 2021-07-02 ENCOUNTER — Other Ambulatory Visit (INDEPENDENT_AMBULATORY_CARE_PROVIDER_SITE_OTHER): Payer: 59

## 2021-07-02 ENCOUNTER — Other Ambulatory Visit: Payer: Self-pay

## 2021-07-02 DIAGNOSIS — O020 Blighted ovum and nonhydatidiform mole: Secondary | ICD-10-CM

## 2021-07-02 NOTE — Progress Notes (Signed)
Pt here for BHCG and TSH per Dr.Duncan. Pt given lab orders and sent downstairs to lab.

## 2021-07-03 ENCOUNTER — Other Ambulatory Visit: Payer: Self-pay | Admitting: Obstetrics and Gynecology

## 2021-07-03 DIAGNOSIS — E039 Hypothyroidism, unspecified: Secondary | ICD-10-CM

## 2021-07-03 MED ORDER — LEVOTHYROXINE SODIUM 100 MCG PO TABS
100.0000 ug | ORAL_TABLET | Freq: Every day | ORAL | 2 refills | Status: DC
Start: 2021-07-03 — End: 2022-04-10

## 2021-07-04 LAB — HCG, QUANTITATIVE, PREGNANCY: HCG, Total, QN: 5 m[IU]/mL

## 2021-07-04 LAB — T4, FREE: Free T4: 1 ng/dL (ref 0.8–1.8)

## 2021-07-04 LAB — TSH: TSH: 5.46 mIU/L — ABNORMAL HIGH

## 2021-07-04 LAB — T3, FREE: T3, Free: 3.2 pg/mL (ref 2.3–4.2)

## 2021-07-09 ENCOUNTER — Other Ambulatory Visit (INDEPENDENT_AMBULATORY_CARE_PROVIDER_SITE_OTHER): Payer: 59

## 2021-07-09 ENCOUNTER — Other Ambulatory Visit: Payer: Self-pay

## 2021-07-09 DIAGNOSIS — O020 Blighted ovum and nonhydatidiform mole: Secondary | ICD-10-CM

## 2021-07-09 LAB — HCG, QUANTITATIVE, PREGNANCY: HCG, Total, QN: <3 m[IU]/mL

## 2021-07-09 NOTE — Progress Notes (Signed)
Pt here for BHCG. Pt given lab order and sent to lab.

## 2021-07-19 ENCOUNTER — Other Ambulatory Visit: Payer: Self-pay | Admitting: *Deleted

## 2021-07-19 DIAGNOSIS — O020 Blighted ovum and nonhydatidiform mole: Secondary | ICD-10-CM

## 2021-07-20 ENCOUNTER — Other Ambulatory Visit: Payer: Self-pay

## 2021-07-20 ENCOUNTER — Other Ambulatory Visit (INDEPENDENT_AMBULATORY_CARE_PROVIDER_SITE_OTHER): Payer: 59

## 2021-07-20 DIAGNOSIS — O020 Blighted ovum and nonhydatidiform mole: Secondary | ICD-10-CM

## 2021-07-20 NOTE — Progress Notes (Signed)
Pt here for labs only. Pt given lab orders and sent to lab.

## 2021-07-21 LAB — HCG, QUANTITATIVE, PREGNANCY: HCG, Total, QN: <3 m[IU]/mL

## 2021-07-21 LAB — TSH: TSH: 1.52 mIU/L

## 2021-07-22 ENCOUNTER — Other Ambulatory Visit: Payer: Self-pay | Admitting: Obstetrics and Gynecology

## 2021-07-22 DIAGNOSIS — R7989 Other specified abnormal findings of blood chemistry: Secondary | ICD-10-CM

## 2021-08-13 ENCOUNTER — Encounter: Payer: Self-pay | Admitting: Obstetrics and Gynecology

## 2021-10-10 ENCOUNTER — Encounter: Payer: Self-pay | Admitting: Obstetrics and Gynecology

## 2021-10-19 ENCOUNTER — Other Ambulatory Visit: Payer: Self-pay

## 2021-10-19 ENCOUNTER — Other Ambulatory Visit (INDEPENDENT_AMBULATORY_CARE_PROVIDER_SITE_OTHER): Payer: 59

## 2021-10-19 DIAGNOSIS — O020 Blighted ovum and nonhydatidiform mole: Secondary | ICD-10-CM

## 2021-10-19 DIAGNOSIS — F908 Attention-deficit hyperactivity disorder, other type: Secondary | ICD-10-CM

## 2021-10-19 LAB — HCG, QUANTITATIVE, PREGNANCY: HCG, Total, QN: <3 m[IU]/mL

## 2021-10-19 NOTE — Progress Notes (Addendum)
Pt here for TSH and BHCG per Dr.Duncan for previous molar pregnancy. Pt given lab orders and sent to lab.  Pt had spoken with Mariel Aloe, RN and asked to leave a urine to send to her PCP so she didn't have to go to two different places for labs. Pt left urine and UDS was sent to lab. Pt is aware her results will be in MyChart and she can send PCP the results.

## 2021-10-20 ENCOUNTER — Other Ambulatory Visit: Payer: Self-pay | Admitting: Obstetrics and Gynecology

## 2021-10-20 DIAGNOSIS — O020 Blighted ovum and nonhydatidiform mole: Secondary | ICD-10-CM

## 2021-10-20 LAB — DM TEMPLATE

## 2021-10-21 LAB — DRUG MONITOR, PANEL 1, W/CONF, URINE
Amphetamine: 1586 ng/mL — ABNORMAL HIGH (ref <250)
Amphetamines: POSITIVE ng/mL — AB (ref <500)
Barbiturates: NEGATIVE ng/mL (ref <300)
Benzodiazepines: NEGATIVE ng/mL (ref <100)
Cocaine Metabolite: NEGATIVE ng/mL (ref <150)
Creatinine: 100.3 mg/dL (ref >=20.0)
Marijuana Metabolite: NEGATIVE ng/mL (ref <20)
Methadone Metabolite: NEGATIVE ng/mL (ref <100)
Methamphetamine: NEGATIVE ng/mL (ref <250)
Opiates: NEGATIVE ng/mL (ref <100)
Oxidant: NEGATIVE ug/mL (ref <200)
Oxycodone: NEGATIVE ng/mL (ref <100)
Phencyclidine: NEGATIVE ng/mL (ref <25)
pH: 6.2 (ref 4.5–9.0)

## 2021-10-21 LAB — DM TEMPLATE

## 2021-11-27 ENCOUNTER — Encounter: Payer: Self-pay | Admitting: Family Medicine

## 2021-11-27 ENCOUNTER — Other Ambulatory Visit: Payer: Self-pay

## 2021-11-27 ENCOUNTER — Ambulatory Visit (INDEPENDENT_AMBULATORY_CARE_PROVIDER_SITE_OTHER): Payer: 59 | Admitting: Family Medicine

## 2021-11-27 DIAGNOSIS — O020 Blighted ovum and nonhydatidiform mole: Secondary | ICD-10-CM | POA: Diagnosis not present

## 2021-11-27 DIAGNOSIS — F419 Anxiety disorder, unspecified: Secondary | ICD-10-CM

## 2021-11-27 DIAGNOSIS — F900 Attention-deficit hyperactivity disorder, predominantly inattentive type: Secondary | ICD-10-CM

## 2021-11-27 MED ORDER — AMPHETAMINE-DEXTROAMPHET ER 30 MG PO CP24
30.0000 mg | ORAL_CAPSULE | Freq: Every morning | ORAL | 0 refills | Status: DC
Start: 2021-11-27 — End: 2022-03-05

## 2021-11-27 MED ORDER — FLUOXETINE HCL 20 MG PO CAPS: 20.0000 mg | ORAL_CAPSULE | Freq: Every day | ORAL | 1 refills | Status: DC

## 2021-11-27 MED ORDER — AMPHETAMINE-DEXTROAMPHETAMINE 10 MG PO TABS
10.0000 mg | ORAL_TABLET | Freq: Every day | ORAL | 0 refills | Status: DC
Start: 2021-11-27 — End: 2022-03-05

## 2021-11-27 NOTE — Assessment & Plan Note (Signed)
Previous records from [facility] reviewed.  This remains well controlled with Adderall XR 30 mg with an additional Adderall 10 mg IR in the afternoon as needed.  We will plan to continue this at current strength.   ?

## 2021-11-27 NOTE — Assessment & Plan Note (Signed)
Previously treated with Zoloft for postpartum depression however did not tolerate well.  Currently taking fluoxetine which is working well as she is tolerating well.  We will continue this at current strength. ?

## 2021-11-27 NOTE — Progress Notes (Signed)
?Taylor Yu - 36 y.o. female MRN [id number]  Date of birth: April 20, 1986 ? ?Subjective ?Chief Complaint  ?Patient presents with  ? Establish Care  ? ? ?HPI ?Taylor Yu is a 36 year old female here today for initial visit to establish care.  She and her husband recently moved here from [place].  She has been in fairly good health.  She is established with GYN here as she was pregnant when they first moved here.  Unfortunately this was complicated by molar pregnancy. ? ?She does have history of ADHD.  She has been prescribed Adderall XR 30 mg daily with Adderall IR 10 mg in the afternoon as needed.  This regimen has worked fairly well for her.  She is tolerating this well without side effects. ? ?She is also taking fluoxetine.  History of postpartum depression and has remained on this since birth of her daughter a few years ago.  This continues to work well for her and she is tolerating well. ? ?ROS:  A comprehensive ROS was completed and negative except as noted per HPI ? ?Allergies  ?Allergen Reactions  ? Meperidine Itching and Rash  ? ? ?Past Medical History:  ?Diagnosis Date  ? ADHD   ? Gestational diabetes   ? History of COVID-19 03/28/2021  ? Polyhydramnios affecting pregnancy   ? Vaginal Pap smear, abnormal   ? ? ?Past Surgical History:  ?Procedure Laterality Date  ? BREAST REDUCTION SURGERY    ? CESAREAN SECTION    ? COLPOSCOPY    ? DILATION AND EVACUATION N/A 06/01/2021  ? Procedure: DILATATION AND EVACUATION;  Surgeon: Chancy Milroy, MD;  Location: [place];  Service: Gynecology;  Laterality: N/A;  ? LEEP    ? OPERATIVE ULTRASOUND N/A 06/01/2021  ? Procedure: OPERATIVE ULTRASOUND;  Surgeon: Chancy Milroy, MD;  Location: [place];  Service: Gynecology;  Laterality: N/A;  ? WISDOM TOOTH EXTRACTION    ? ? ?Social History  ? ?Socioeconomic History  ? Marital status: Married  ?  Spouse name: Not on file  ? Number of children: Not on file  ? Years of education: Not on  file  ? Highest education level: Not on file  ?Occupational History  ? Not on file  ?Tobacco Use  ? Smoking status: Never  ? Smokeless tobacco: Never  ?Vaping Use  ? Vaping Use: Not on file  ?Substance and Sexual Activity  ? Alcohol use: Not Currently  ? Drug use: Never  ? Sexual activity: Yes  ?  Birth control/protection: None  ?Other Topics Concern  ? Not on file  ?Social History Narrative  ? Not on file  ? ?Social Determinants of Health  ? ?Financial Resource Strain: Not on file  ?Food Insecurity: Not on file  ?Transportation Needs: Not on file  ?Physical Activity: Not on file  ?Stress: Not on file  ?Social Connections: Not on file  ? ? ?Family History  ?Problem Relation Age of Onset  ? Multiple sclerosis Mother   ? ADD / ADHD Sister   ? Heart defect Brother   ? Cancer Maternal Aunt   ? Ovarian cancer Maternal Aunt   ? Cancer Maternal Grandmother   ? Alzheimer's disease Paternal Grandmother   ? Stroke Paternal Grandmother   ? ? ?Health Maintenance  ?Topic Date Due  ? COVID-19 Vaccine (2 - Pfizer series) 12/13/2021 (Originally 07/30/2021)  ? Hepatitis C Screening  11/28/2022 (Originally 11/19/2004)  ? HIV Screening  11/28/2022 (Originally 11/19/2001)  ? PAP  SMEAR-Modifier  11/09/2022  ? TETANUS/TDAP  01/22/2028  ? INFLUENZA VACCINE  Completed  ? HPV VACCINES  Aged Out  ? ? ? ?----------------------------------------------------------------------------------------------------------------------------------------------------------------------------------------------------------------- ?Physical Exam ?BP 117/83 (BP Location: Right Arm, Patient Position: Sitting, Cuff Size: Normal)   Pulse 96   Ht 5\' 6"  (1.676 m)   Wt 174 lb (78.9 kg)   SpO2 99%   BMI 28.08 kg/m?  ? ?Physical Exam ?Constitutional:   ?   Appearance: Normal appearance.  ?Eyes:  ?   General: No scleral icterus. ?Cardiovascular:  ?   Rate and Rhythm: Normal rate and regular rhythm.  ?Pulmonary:  ?   Effort: Pulmonary effort is normal.  ?   Breath sounds:  Normal breath sounds.  ?Musculoskeletal:  ?   Cervical back: Neck supple.  ?Neurological:  ?   Mental Status: She is alert.  ?Psychiatric:     ?   Mood and Affect: Mood normal.     ?   Behavior: Behavior normal.  ? ? ?------------------------------------------------------------------------------------------------------------------------------------------------------------------------------------------------------------------- ?Assessment and Plan ? ?Anxiety ?Previously treated with Zoloft for postpartum depression however did not tolerate well.  Currently taking fluoxetine which is working well as she is tolerating well.  We will continue this at current strength. ? ?ADHD ?Previous records from [facility] reviewed.  This remains well controlled with Adderall XR 30 mg with an additional Adderall 10 mg IR in the afternoon as needed.  We will plan to continue this at current strength.   ? ?Molar pregnancy ?Management per GYN.   ? ? ?Meds ordered this encounter  ?Medications  ? amphetamine-dextroamphetamine (ADDERALL XR) 30 MG 24 hr capsule  ?  Sig: Take 1 capsule (30 mg total) by mouth every morning.  ?  Dispense:  90 capsule  ?  Refill:  0  ? amphetamine-dextroamphetamine (ADDERALL) 10 MG tablet  ?  Sig: Take 1 tablet (10 mg total) by mouth daily.  ?  Dispense:  90 tablet  ?  Refill:  0  ? FLUoxetine (PROZAC) 20 MG capsule  ?  Sig: Take 1 capsule (20 mg total) by mouth daily.  ?  Dispense:  90 capsule  ?  Refill:  1  ? ? ?Return in about 6 months (around 05/30/2022) for ADHD. ? ? ? ?This visit occurred during the SARS-CoV-2 public health emergency.  Safety protocols were in place, including screening questions prior to the visit, additional usage of staff PPE, and extensive cleaning of exam room while observing appropriate contact time as indicated for disinfecting solutions.  ? ?

## 2021-11-27 NOTE — Patient Instructions (Signed)
Great to meet you today! ?Medications have been renewed.  ?Follow up in 6 months or sooner if needed.  ?

## 2021-11-27 NOTE — Assessment & Plan Note (Signed)
Management per GYN.  

## 2022-01-16 ENCOUNTER — Observation Stay (HOSPITAL_BASED_OUTPATIENT_CLINIC_OR_DEPARTMENT_OTHER)
Admission: EM | Admit: 2022-01-16 | Discharge: 2022-01-17 | Disposition: A | Discharge status: Home / Self Care | Payer: 59 | Attending: General Surgery | Admitting: General Surgery

## 2022-01-16 ENCOUNTER — Other Ambulatory Visit: Payer: Self-pay

## 2022-01-16 ENCOUNTER — Emergency Department (HOSPITAL_COMMUNITY): Payer: 59 | Admitting: Certified Registered Nurse Anesthetist

## 2022-01-16 ENCOUNTER — Encounter (HOSPITAL_COMMUNITY)
Admission: EM | Disposition: A | Discharge status: Home / Self Care | Payer: Self-pay | Source: Home / Self Care | Attending: Emergency Medicine

## 2022-01-16 ENCOUNTER — Encounter (HOSPITAL_BASED_OUTPATIENT_CLINIC_OR_DEPARTMENT_OTHER): Payer: Self-pay | Admitting: Urology

## 2022-01-16 ENCOUNTER — Emergency Department (HOSPITAL_BASED_OUTPATIENT_CLINIC_OR_DEPARTMENT_OTHER): Payer: 59 | Admitting: Certified Registered Nurse Anesthetist

## 2022-01-16 ENCOUNTER — Emergency Department (INDEPENDENT_AMBULATORY_CARE_PROVIDER_SITE_OTHER): Payer: 59

## 2022-01-16 ENCOUNTER — Emergency Department (INDEPENDENT_AMBULATORY_CARE_PROVIDER_SITE_OTHER)
Admission: EM | Admit: 2022-01-16 | Discharge: 2022-01-16 | Disposition: A | Discharge status: Home / Self Care | Payer: 59 | Source: Home / Self Care

## 2022-01-16 DIAGNOSIS — R110 Nausea: Secondary | ICD-10-CM | POA: Diagnosis not present

## 2022-01-16 DIAGNOSIS — R1031 Right lower quadrant pain: Secondary | ICD-10-CM | POA: Diagnosis present

## 2022-01-16 DIAGNOSIS — K3580 Unspecified acute appendicitis: Secondary | ICD-10-CM | POA: Diagnosis present

## 2022-01-16 DIAGNOSIS — Z8616 Personal history of COVID-19: Secondary | ICD-10-CM | POA: Diagnosis not present

## 2022-01-16 DIAGNOSIS — K353 Acute appendicitis with localized peritonitis, without perforation or gangrene: Secondary | ICD-10-CM

## 2022-01-16 DIAGNOSIS — K37 Unspecified appendicitis: Secondary | ICD-10-CM

## 2022-01-16 HISTORY — PX: LAPAROSCOPIC APPENDECTOMY: SHX408

## 2022-01-16 LAB — COMPREHENSIVE METABOLIC PANEL
ALT: 16 U/L (ref 0–44)
AST: 19 U/L (ref 15–41)
Albumin: 4.1 g/dL (ref 3.5–5.0)
Alkaline Phosphatase: 61 U/L (ref 38–126)
Anion gap: 8 (ref 5–15)
BUN: 12 mg/dL (ref 6–20)
CO2: 27 mmol/L (ref 22–32)
Calcium: 8.9 mg/dL (ref 8.9–10.3)
Chloride: 100 mmol/L (ref 98–111)
Creatinine, Ser: 0.68 mg/dL (ref 0.44–1.00)
GFR, Estimated: >60 mL/min (ref >60)
Glucose, Bld: 144 mg/dL — ABNORMAL HIGH (ref 70–99)
Potassium: 3.4 mmol/L — ABNORMAL LOW (ref 3.5–5.1)
Sodium: 135 mmol/L (ref 135–145)
Total Bilirubin: 0.8 mg/dL (ref 0.3–1.2)
Total Protein: 7.3 g/dL (ref 6.5–8.1)

## 2022-01-16 LAB — URINALYSIS, ROUTINE W REFLEX MICROSCOPIC
Bilirubin Urine: NEGATIVE
Glucose, UA: NEGATIVE mg/dL
Hgb urine dipstick: NEGATIVE
Ketones, ur: NEGATIVE mg/dL
Leukocytes,Ua: NEGATIVE
Nitrite: NEGATIVE
Protein, ur: NEGATIVE mg/dL
Specific Gravity, Urine: <=1.005 (ref 1.005–1.030)
pH: 6 (ref 5.0–8.0)

## 2022-01-16 LAB — CBC
HCT: 35.2 % — ABNORMAL LOW (ref 36.0–46.0)
Hemoglobin: 12.1 g/dL (ref 12.0–15.0)
MCH: 30.8 pg (ref 26.0–34.0)
MCHC: 34.4 g/dL (ref 30.0–36.0)
MCV: 89.6 fL (ref 80.0–100.0)
Platelets: 305 10*3/uL (ref 150–400)
RBC: 3.93 MIL/uL (ref 3.87–5.11)
RDW: 11.9 % (ref 11.5–15.5)
WBC: 16.1 10*3/uL — ABNORMAL HIGH (ref 4.0–10.5)
nRBC: 0 % (ref 0.0–0.2)

## 2022-01-16 LAB — LIPASE, BLOOD: Lipase: 25 U/L (ref 11–51)

## 2022-01-16 LAB — PREGNANCY, URINE: Preg Test, Ur: NEGATIVE

## 2022-01-16 SURGERY — APPENDECTOMY, LAPAROSCOPIC
Anesthesia: General | Site: Abdomen

## 2022-01-16 MED ORDER — FLUOXETINE HCL 20 MG PO CAPS
20.0000 mg | ORAL_CAPSULE | Freq: Every day | ORAL | Status: DC
  Administered 2022-01-17: 20 mg via ORAL
  Filled 2022-01-16: qty 1

## 2022-01-16 MED ORDER — FENTANYL CITRATE PF 50 MCG/ML IJ SOSY: 25.0000 ug | PREFILLED_SYRINGE | INTRAMUSCULAR | Status: DC | PRN

## 2022-01-16 MED ORDER — HYDROMORPHONE HCL 1 MG/ML IJ SOLN: 0.5000 mg | INTRAMUSCULAR | Status: DC | PRN

## 2022-01-16 MED ORDER — METHOCARBAMOL 500 MG IVPB - SIMPLE MED
INTRAVENOUS | Status: AC
Start: 2022-01-16 — End: 2022-01-17
  Filled 2022-01-16: qty 50

## 2022-01-16 MED ORDER — FENTANYL CITRATE PF 50 MCG/ML IJ SOSY
PREFILLED_SYRINGE | INTRAMUSCULAR | Status: AC
  Administered 2022-01-16: 25 ug via INTRAVENOUS
  Filled 2022-01-16: qty 1

## 2022-01-16 MED ORDER — CEFTRIAXONE SODIUM 2 G IJ SOLR
2.0000 g | Freq: Once | INTRAMUSCULAR | Status: AC
  Administered 2022-01-16: 2 g via INTRAVENOUS
  Filled 2022-01-16: qty 20

## 2022-01-16 MED ORDER — ONDANSETRON 4 MG PO TBDP: 4.0000 mg | ORAL_TABLET | Freq: Four times a day (QID) | ORAL | Status: DC | PRN

## 2022-01-16 MED ORDER — TRAMADOL HCL 50 MG PO TABS
50.0000 mg | ORAL_TABLET | Freq: Four times a day (QID) | ORAL | Status: DC | PRN
  Administered 2022-01-17: 50 mg via ORAL
  Filled 2022-01-16: qty 1

## 2022-01-16 MED ORDER — MIDAZOLAM HCL 5 MG/5ML IJ SOLN
INTRAMUSCULAR | Status: DC | PRN
Start: 2022-01-16 — End: 2022-01-16
  Administered 2022-01-16: 2 mg via INTRAVENOUS

## 2022-01-16 MED ORDER — FENTANYL CITRATE PF 50 MCG/ML IJ SOSY
PREFILLED_SYRINGE | INTRAMUSCULAR | Status: AC
Start: 2022-01-16 — End: 2022-01-17
  Filled 2022-01-16: qty 1

## 2022-01-16 MED ORDER — FENTANYL CITRATE (PF) 100 MCG/2ML IJ SOLN
INTRAMUSCULAR | Status: AC
  Filled 2022-01-16: qty 2

## 2022-01-16 MED ORDER — ENOXAPARIN SODIUM 40 MG/0.4ML IJ SOSY
40.0000 mg | PREFILLED_SYRINGE | INTRAMUSCULAR | Status: DC
  Administered 2022-01-17: 40 mg via SUBCUTANEOUS
  Filled 2022-01-16: qty 0.4

## 2022-01-16 MED ORDER — DEXAMETHASONE SODIUM PHOSPHATE 10 MG/ML IJ SOLN
INTRAMUSCULAR | Status: AC
  Filled 2022-01-16: qty 1

## 2022-01-16 MED ORDER — ONDANSETRON HCL 4 MG/2ML IJ SOLN
INTRAMUSCULAR | Status: AC
  Administered 2022-01-16: 4 mg via INTRAVENOUS
  Filled 2022-01-16: qty 2

## 2022-01-16 MED ORDER — IOHEXOL 300 MG/ML  SOLN
100.0000 mL | Freq: Once | INTRAMUSCULAR | Status: AC | PRN
  Administered 2022-01-16: 100 mL via INTRAVENOUS

## 2022-01-16 MED ORDER — LACTATED RINGERS IR SOLN
Status: DC | PRN
  Administered 2022-01-16: 1000 mL

## 2022-01-16 MED ORDER — FENTANYL CITRATE PF 50 MCG/ML IJ SOSY
25.0000 ug | PREFILLED_SYRINGE | INTRAMUSCULAR | Status: DC | PRN
  Administered 2022-01-16 (×2): 25 ug via INTRAVENOUS

## 2022-01-16 MED ORDER — MIDAZOLAM HCL 2 MG/2ML IJ SOLN
INTRAMUSCULAR | Status: AC
  Filled 2022-01-16: qty 2

## 2022-01-16 MED ORDER — MEPERIDINE HCL 50 MG/ML IJ SOLN: 6.2500 mg | INTRAMUSCULAR | Status: DC | PRN

## 2022-01-16 MED ORDER — ONDANSETRON HCL 4 MG/2ML IJ SOLN
INTRAMUSCULAR | Status: AC
  Filled 2022-01-16: qty 2

## 2022-01-16 MED ORDER — ACETAMINOPHEN 160 MG/5ML PO SOLN: 325.0000 mg | ORAL | Status: DC | PRN

## 2022-01-16 MED ORDER — SUGAMMADEX SODIUM 200 MG/2ML IV SOLN
INTRAVENOUS | Status: DC | PRN
Start: 2022-01-16 — End: 2022-01-16
  Administered 2022-01-16: 200 mg via INTRAVENOUS

## 2022-01-16 MED ORDER — MORPHINE SULFATE (PF) 4 MG/ML IV SOLN
4.0000 mg | Freq: Once | INTRAVENOUS | Status: AC
  Administered 2022-01-16: 4 mg via INTRAVENOUS
  Filled 2022-01-16: qty 1

## 2022-01-16 MED ORDER — METHOCARBAMOL 500 MG IVPB - SIMPLE MED
500.0000 mg | Freq: Three times a day (TID) | INTRAVENOUS | Status: DC | PRN
  Administered 2022-01-16: 500 mg via INTRAVENOUS
  Filled 2022-01-16: qty 50

## 2022-01-16 MED ORDER — OXYCODONE HCL 5 MG PO TABS: 5.0000 mg | ORAL_TABLET | Freq: Once | ORAL | Status: DC | PRN

## 2022-01-16 MED ORDER — ONDANSETRON HCL 4 MG/2ML IJ SOLN: 4.0000 mg | Freq: Once | INTRAMUSCULAR | Status: AC

## 2022-01-16 MED ORDER — PROPOFOL 10 MG/ML IV BOLUS
INTRAVENOUS | Status: AC
  Filled 2022-01-16: qty 20

## 2022-01-16 MED ORDER — CHLORHEXIDINE GLUCONATE CLOTH 2 % EX PADS
6.0000 | MEDICATED_PAD | Freq: Once | CUTANEOUS | Status: DC
  Filled 2022-01-16: qty 6

## 2022-01-16 MED ORDER — ACETAMINOPHEN 325 MG PO TABS: 325.0000 mg | ORAL_TABLET | ORAL | Status: DC | PRN

## 2022-01-16 MED ORDER — HYDROMORPHONE HCL 1 MG/ML IJ SOLN
1.0000 mg | Freq: Once | INTRAMUSCULAR | Status: AC
  Administered 2022-01-16: 1 mg via INTRAVENOUS
  Filled 2022-01-16: qty 1

## 2022-01-16 MED ORDER — METRONIDAZOLE 500 MG/100ML IV SOLN
500.0000 mg | Freq: Once | INTRAVENOUS | Status: AC
Start: 2022-01-16 — End: 2022-01-16
  Administered 2022-01-16: 500 mg via INTRAVENOUS
  Filled 2022-01-16: qty 100

## 2022-01-16 MED ORDER — DIPHENHYDRAMINE HCL 50 MG/ML IJ SOLN: 25.0000 mg | Freq: Four times a day (QID) | INTRAMUSCULAR | Status: DC | PRN

## 2022-01-16 MED ORDER — ONDANSETRON HCL 4 MG/2ML IJ SOLN: 4.0000 mg | Freq: Four times a day (QID) | INTRAMUSCULAR | Status: DC | PRN

## 2022-01-16 MED ORDER — OXYCODONE HCL 5 MG/5ML PO SOLN: 5.0000 mg | Freq: Once | ORAL | Status: AC | PRN

## 2022-01-16 MED ORDER — PROPOFOL 10 MG/ML IV BOLUS
INTRAVENOUS | Status: DC | PRN
  Administered 2022-01-16: 150 mg via INTRAVENOUS

## 2022-01-16 MED ORDER — OXYCODONE HCL 5 MG PO TABS
ORAL_TABLET | ORAL | Status: AC
  Filled 2022-01-16: qty 1

## 2022-01-16 MED ORDER — BUPIVACAINE-EPINEPHRINE 0.25% -1:200000 IJ SOLN
INTRAMUSCULAR | Status: DC | PRN
  Administered 2022-01-16: 20 mL

## 2022-01-16 MED ORDER — ONDANSETRON HCL 4 MG/2ML IJ SOLN
4.0000 mg | Freq: Once | INTRAMUSCULAR | Status: AC
  Administered 2022-01-16: 4 mg via INTRAVENOUS
  Filled 2022-01-16: qty 2

## 2022-01-16 MED ORDER — ROCURONIUM BROMIDE 10 MG/ML (PF) SYRINGE
PREFILLED_SYRINGE | INTRAVENOUS | Status: DC | PRN
Start: 2022-01-16 — End: 2022-01-16
  Administered 2022-01-16: 40 mg via INTRAVENOUS

## 2022-01-16 MED ORDER — OXYCODONE HCL 5 MG/5ML PO SOLN: 5.0000 mg | Freq: Once | ORAL | Status: DC | PRN

## 2022-01-16 MED ORDER — LEVOTHYROXINE SODIUM 100 MCG PO TABS
100.0000 ug | ORAL_TABLET | Freq: Every day | ORAL | Status: DC
  Administered 2022-01-17: 100 ug via ORAL
  Filled 2022-01-16: qty 1

## 2022-01-16 MED ORDER — ONDANSETRON HCL 4 MG/2ML IJ SOLN
4.0000 mg | Freq: Once | INTRAMUSCULAR | Status: AC | PRN
  Administered 2022-01-16: 4 mg via INTRAVENOUS

## 2022-01-16 MED ORDER — OXYCODONE HCL 5 MG PO TABS
5.0000 mg | ORAL_TABLET | Freq: Once | ORAL | Status: AC | PRN
  Administered 2022-01-16: 5 mg via ORAL

## 2022-01-16 MED ORDER — LACTATED RINGERS IV SOLN: INTRAVENOUS | Status: DC | PRN

## 2022-01-16 MED ORDER — DIPHENHYDRAMINE HCL 25 MG PO CAPS: 25.0000 mg | ORAL_CAPSULE | Freq: Four times a day (QID) | ORAL | Status: DC | PRN

## 2022-01-16 MED ORDER — FENTANYL CITRATE (PF) 100 MCG/2ML IJ SOLN
INTRAMUSCULAR | Status: DC | PRN
  Administered 2022-01-16: 100 ug via INTRAVENOUS

## 2022-01-16 MED ORDER — ONDANSETRON HCL 4 MG/2ML IJ SOLN: 4.0000 mg | Freq: Once | INTRAMUSCULAR | Status: DC | PRN

## 2022-01-16 MED ORDER — SODIUM CHLORIDE 0.9 % IV BOLUS
1000.0000 mL | Freq: Once | INTRAVENOUS | Status: AC
  Administered 2022-01-16: 1000 mL via INTRAVENOUS

## 2022-01-16 MED ORDER — SUCCINYLCHOLINE CHLORIDE 200 MG/10ML IV SOSY
PREFILLED_SYRINGE | INTRAVENOUS | Status: DC | PRN
  Administered 2022-01-16: 180 mg via INTRAVENOUS

## 2022-01-16 MED ORDER — AMPHETAMINE-DEXTROAMPHET ER 30 MG PO CP24
30.0000 mg | ORAL_CAPSULE | Freq: Every morning | ORAL | Status: DC
  Filled 2022-01-16: qty 1

## 2022-01-16 MED ORDER — DEXAMETHASONE SODIUM PHOSPHATE 10 MG/ML IJ SOLN
INTRAMUSCULAR | Status: DC | PRN
  Administered 2022-01-16: 10 mg via INTRAVENOUS

## 2022-01-16 MED ORDER — ACETAMINOPHEN 500 MG PO TABS
1000.0000 mg | ORAL_TABLET | Freq: Three times a day (TID) | ORAL | Status: DC
  Administered 2022-01-17: 1000 mg via ORAL
  Filled 2022-01-16: qty 2

## 2022-01-16 MED ORDER — METHOCARBAMOL 500 MG PO TABS
500.0000 mg | ORAL_TABLET | Freq: Three times a day (TID) | ORAL | Status: DC | PRN
  Administered 2022-01-17: 500 mg via ORAL
  Filled 2022-01-16: qty 1

## 2022-01-16 MED ORDER — LIDOCAINE 2% (20 MG/ML) 5 ML SYRINGE
INTRAMUSCULAR | Status: DC | PRN
  Administered 2022-01-16: 50 mg via INTRAVENOUS

## 2022-01-16 MED ORDER — DOCUSATE SODIUM 100 MG PO CAPS
100.0000 mg | ORAL_CAPSULE | Freq: Two times a day (BID) | ORAL | Status: DC
  Administered 2022-01-17: 100 mg via ORAL
  Filled 2022-01-16: qty 1

## 2022-01-16 SURGICAL SUPPLY — 52 items
APPLIER CLIP 5 13 M/L LIGAMAX5 (MISCELLANEOUS)
APPLIER CLIP ROT 10 11.4 M/L (STAPLE)
BAG COUNTER SPONGE SURGICOUNT (BAG) IMPLANT
BAG RETRIEVAL 10 (BASKET) ×1
CABLE HIGH FREQUENCY MONO STRZ (ELECTRODE) ×2 IMPLANT
CHLORAPREP W/TINT 26 (MISCELLANEOUS) ×2 IMPLANT
CLIP APPLIE 5 13 M/L LIGAMAX5 (MISCELLANEOUS) IMPLANT
CLIP APPLIE ROT 10 11.4 M/L (STAPLE) IMPLANT
COVER SURGICAL LIGHT HANDLE (MISCELLANEOUS) ×2 IMPLANT
CUTTER FLEX LINEAR 45M (STAPLE) IMPLANT
DERMABOND ADVANCED (GAUZE/BANDAGES/DRESSINGS) ×1
DERMABOND ADVANCED .7 DNX12 (GAUZE/BANDAGES/DRESSINGS) ×1 IMPLANT
DRAIN CHANNEL 19F RND (DRAIN) IMPLANT
ELECT REM PT RETURN 15FT ADLT (MISCELLANEOUS) ×2 IMPLANT
ENDOLOOP SUT PDS II  0 18 (SUTURE)
ENDOLOOP SUT PDS II 0 18 (SUTURE) IMPLANT
EVACUATOR SILICONE 100CC (DRAIN) IMPLANT
GLOVE BIO SURGEON STRL SZ 6 (GLOVE) ×2 IMPLANT
GLOVE BIOGEL PI MICRO 5.5 (GLOVE) ×1
GLOVE BIOGEL PI MICRO STRL 5.5 (GLOVE) ×1 IMPLANT
GLOVE INDICATOR 6.5 STRL GRN (GLOVE) ×2 IMPLANT
GOWN STRL REUS W/ TWL LRG LVL3 (GOWN DISPOSABLE) ×1 IMPLANT
GOWN STRL REUS W/TWL LRG LVL3 (GOWN DISPOSABLE) ×1
GRASPER SUT TROCAR 14GX15 (MISCELLANEOUS) IMPLANT
IRRIG SUCT STRYKERFLOW 2 WTIP (MISCELLANEOUS) ×2
IRRIGATION SUCT STRKRFLW 2 WTP (MISCELLANEOUS) ×1 IMPLANT
KIT BASIN OR (CUSTOM PROCEDURE TRAY) ×2 IMPLANT
KIT TURNOVER KIT A (KITS) IMPLANT
NDL INSUFFLATION 14GA 120MM (NEEDLE) IMPLANT
NEEDLE INSUFFLATION 14GA 120MM (NEEDLE) IMPLANT
PENCIL SMOKE EVACUATOR (MISCELLANEOUS) IMPLANT
RELOAD 45 VASCULAR/THIN (ENDOMECHANICALS) IMPLANT
RELOAD STAPLE 45 2.5 WHT GRN (ENDOMECHANICALS) IMPLANT
RELOAD STAPLE 45 3.5 BLU ETS (ENDOMECHANICALS) IMPLANT
RELOAD STAPLE TA45 3.5 REG BLU (ENDOMECHANICALS) IMPLANT
SCISSORS LAP 5X35 DISP (ENDOMECHANICALS) ×2 IMPLANT
SET TUBE SMOKE EVAC HIGH FLOW (TUBING) ×2 IMPLANT
SHEARS HARMONIC ACE PLUS 36CM (ENDOMECHANICALS) IMPLANT
SLEEVE XCEL OPT CAN 5 100 (ENDOMECHANICALS) ×2 IMPLANT
SPIKE FLUID TRANSFER (MISCELLANEOUS) ×2 IMPLANT
SUT ETHILON 2 0 PS N (SUTURE) IMPLANT
SUT MNCRL AB 4-0 PS2 18 (SUTURE) ×2 IMPLANT
SYS BAG RETRIEVAL 10MM (BASKET) ×1
SYSTEM BAG RETRIEVAL 10MM (BASKET) ×1 IMPLANT
TOWEL OR 17X26 10 PK STRL BLUE (TOWEL DISPOSABLE) ×2 IMPLANT
TOWEL OR NON WOVEN STRL DISP B (DISPOSABLE) ×2 IMPLANT
TRAY FOLEY MTR SLVR 14FR STAT (SET/KITS/TRAYS/PACK) IMPLANT
TRAY FOLEY MTR SLVR 16FR STAT (SET/KITS/TRAYS/PACK) IMPLANT
TRAY LAPAROSCOPIC (CUSTOM PROCEDURE TRAY) ×2 IMPLANT
TROCAR BLADELESS OPT 5 100 (ENDOMECHANICALS) ×2 IMPLANT
TROCAR XCEL 12X100 BLDLESS (ENDOMECHANICALS) ×2 IMPLANT
TROCAR XCEL BLUNT TIP 100MML (ENDOMECHANICALS) ×2 IMPLANT

## 2022-01-16 NOTE — ED Notes (Signed)
[facility] at bedside. Pt stable for transport. 

## 2022-01-16 NOTE — Anesthesia Preprocedure Evaluation (Addendum)
Anesthesia Evaluation  ?Patient identified by MRN, date of birth, ID band ?Patient awake ? ? ? ?Reviewed: ?Allergy & Precautions, NPO status , Patient's Chart, lab work & pertinent test results ? ?Airway ?Mallampati: I ? ?TM Distance: >3 FB ?Neck ROM: Full ? ? ? Dental ? ?(+) Teeth Intact, Dental Advisory Given ?  ?Pulmonary ?neg pulmonary ROS,  ?  ?Pulmonary exam normal ? ? ? ? ? ? ? Cardiovascular ?negative cardio ROS ? ? ?Rhythm:Regular Rate:Normal ? ? ?  ?Neuro/Psych ?Anxiety negative neurological ROS ?   ? GI/Hepatic ?negative GI ROS, Neg liver ROS,   ?Endo/Other  ?diabetes ? Renal/GU ?negative Renal ROS  ?negative genitourinary ?  ?Musculoskeletal ?negative musculoskeletal ROS ?(+)  ? Abdominal ?(+)  ?Abdomen: soft. ? ?  ?Peds ? ?(+) ADHD Hematology ?negative hematology ROS ?(+)   ?Anesthesia Other Findings ? ? Reproductive/Obstetrics ?Molar pregnancy  ? ?  ? ? ? ? ? ? ? ? ? ? ? ? ? ?  ?  ? ? ? ? ? ? ? ?Anesthesia Physical ? ?Anesthesia Plan ? ?ASA: 2 and emergent ? ?Anesthesia Plan: General  ? ?Post-op Pain Management: Ofirmev IV (intra-op)*  ? ?Induction: Intravenous, Cricoid pressure planned and Rapid sequence ? ?PONV Risk Score and Plan: 3 and Ondansetron, Dexamethasone, Midazolam and Treatment may vary due to age or medical condition ? ?Airway Management Planned: Oral ETT ? ?Additional Equipment: None ? ?Intra-op Plan:  ? ?Post-operative Plan: Extubation in OR ? ?Informed Consent: I have reviewed the patients History and Physical, chart, labs and discussed the procedure including the risks, benefits and alternatives for the proposed anesthesia with the patient or authorized representative who has indicated his/her understanding and acceptance.  ? ? ? ?Dental advisory given ? ?Plan Discussed with: CRNA and Anesthesiologist ? ?Anesthesia Plan Comments: (       ?)  ? ? ? ? ? ?Anesthesia Quick Evaluation ? ?

## 2022-01-16 NOTE — ED Triage Notes (Signed)
Pt here today for abd pain that started around 9 am. Some nausea. Seen at another UC earlier today, neg pregnancy test. Was advised to go to ED but wait was too long so she came here due to having imaging capability.  ?

## 2022-01-16 NOTE — ED Notes (Signed)
Attempted to call report to [facility] short stay. No answer by staff. ?

## 2022-01-16 NOTE — ED Notes (Signed)
Patient is being discharged from the Urgent Care and sent to the Emergency Department via POV. Per Guy Sandifer PA, patient is in need of higher level of care due to uncomplicated appendicitis. Patient is aware and verbalizes understanding of plan of care.  ?Vitals:  ? 01/16/22 1315  ?BP: 110/74  ?Pulse: 86  ?Resp: 18  ?Temp: 97.8 ?F (36.6 ?C)  ?SpO2: 100%  ? ? ?

## 2022-01-16 NOTE — Discharge Instructions (Signed)
Your CT scan shows acute appendicitis. ?Please had to the emergency room for the appropriate work-up and treatment. ?

## 2022-01-16 NOTE — Transfer of Care (Signed)
Immediate Anesthesia Transfer of Care Note ? ?Patient: Taylor Yu ? ?Procedure(s) Performed: Procedure(s): ?APPENDECTOMY LAPAROSCOPIC (N/A) ? ?Patient Location: PACU ? ?Anesthesia Type:General ? ?Level of Consciousness: Alert, Awake, Oriented ? ?Airway & Oxygen Therapy: Patient Spontanous Breathing ? ?Post-op Assessment: Report given to RN ? ?Post vital signs: Reviewed and stable ? ?Last Vitals:  ?Vitals:  ? 01/16/22 1800 01/16/22 1828  ?BP: 109/72   ?Pulse: 83 90  ?Resp: 18   ?Temp:    ?SpO2: 100% 98%  ? ? ?Complications: No apparent anesthesia complications ? ?

## 2022-01-16 NOTE — ED Provider Notes (Signed)
?[facility] EMERGENCY DEPARTMENT ?Provider Note ? ? ?CSN: [id number] ?Arrival date & time: 01/16/22  1631 ? ?  ? ?History ? ?Chief Complaint  ?Patient presents with  ? Appendicitis from UC   ? ? ?Taylor Yu is a 36 y.o. female.  With past medical history of ADHD, who presents to the emergency department with abdominal pain.  Patient states that she began having mid, sharp abdominal pain this morning around 9 AM.  She describes it as constant.  She states that she went to an urgent care who tested her urine and then sent her to another urgent care to have a CT.  At the second urgent care she had a CT that showed that she had early uncomplicated appendicitis and they instructed her to present to the emergency department.  She states that since this morning the pain has moved to her right side.  She describes it as dull with episodes of sharp pain.  She feels diffusely tender when she palpates her abdomen.  She has had associated nausea and vomiting.  She has had cold chills.  No fevers or diarrhea.  No vaginal discharge or urinary symptoms. ? ?HPI ? ?  ? ?Home Medications ?Prior to Admission medications   ?Medication Sig Start Date End Date Taking? Authorizing Provider  ?amphetamine-dextroamphetamine (ADDERALL XR) 30 MG 24 hr capsule Take 1 capsule (30 mg total) by mouth every morning. 11/27/21   Everrett Coombe, DO  ?amphetamine-dextroamphetamine (ADDERALL) 10 MG tablet Take 1 tablet (10 mg total) by mouth daily. 11/27/21   Everrett Coombe, DO  ?FLUoxetine (PROZAC) 20 MG capsule Take 1 capsule (20 mg total) by mouth daily. 11/27/21   Everrett Coombe, DO  ?levothyroxine (SYNTHROID) 100 MCG tablet Take 1 tablet (100 mcg total) by mouth daily before breakfast. 07/03/21   Milas Hock, MD  ?   ? ?Allergies    ?Meperidine   ? ?Review of Systems   ?Review of Systems  ?Constitutional:  Positive for chills. Negative for fever.  ?Gastrointestinal:  Positive for abdominal pain, nausea and vomiting. Negative for diarrhea.   ?All other systems reviewed and are negative. ? ?Physical Exam ?Updated Vital Signs ?BP 91/61 (BP Location: Right Arm)   Pulse 94   Temp 98.4 ?F (36.9 ?C) (Oral)   Resp 18   Ht 5\' 6"  (1.676 m)   Wt 78.9 kg   LMP 01/04/2022 (Exact Date)   SpO2 100%   BMI 28.08 kg/m?  ?Physical Exam ?Vitals and nursing note reviewed.  ?Constitutional:   ?   General: She is not in acute distress. ?   Appearance: Normal appearance. She is normal weight. She is ill-appearing. She is not toxic-appearing.  ?HENT:  ?   Head: Normocephalic.  ?   Mouth/Throat:  ?   Mouth: Mucous membranes are moist.  ?   Pharynx: Oropharynx is clear.  ?Eyes:  ?   General: No scleral icterus. ?   Extraocular Movements: Extraocular movements intact.  ?Cardiovascular:  ?   Rate and Rhythm: Normal rate and regular rhythm.  ?   Pulses: Normal pulses.  ?   Heart sounds: No murmur heard. ?Pulmonary:  ?   Effort: Pulmonary effort is normal. No respiratory distress.  ?   Breath sounds: Normal breath sounds.  ?Abdominal:  ?   General: Bowel sounds are normal. There is no distension.  ?   Palpations: Abdomen is soft.  ?   Tenderness: There is generalized abdominal tenderness. There is guarding.  ?   Comments:  Diffusely tender to palpation R>L. Tender to light palpation. Differed deep palpation d/t pain. Guarding her right abdomen  ?Musculoskeletal:     ?   General: Normal range of motion.  ?   Cervical back: Neck supple.  ?Skin: ?   General: Skin is warm and dry.  ?   Capillary Refill: Capillary refill takes less than 2 seconds.  ?Neurological:  ?   General: No focal deficit present.  ?   Mental Status: She is alert and oriented to person, place, and time. Mental status is at baseline.  ?Psychiatric:     ?   Mood and Affect: Mood normal.     ?   Behavior: Behavior normal.     ?   Thought Content: Thought content normal.     ?   Judgment: Judgment normal.  ? ? ?ED Results / Procedures / Treatments   ?Labs ?(all labs ordered are listed, but only abnormal results  are displayed) ?Labs Reviewed  ?COMPREHENSIVE METABOLIC PANEL - Abnormal; Notable for the following components:  ?    Result Value  ? Potassium 3.4 (*)   ? Glucose, Bld 144 (*)   ? All other components within normal limits  ?CBC - Abnormal; Notable for the following components:  ? WBC 16.1 (*)   ? HCT 35.2 (*)   ? All other components within normal limits  ?LIPASE, BLOOD  ?URINALYSIS, ROUTINE W REFLEX MICROSCOPIC  ?PREGNANCY, URINE  ? ?EKG ?None ? ?Radiology ?CT ABDOMEN PELVIS W CONTRAST ? ?Result Date: 01/16/2022 ?CLINICAL DATA:  Right lower quadrant abdominal pain with nausea since this morning. EXAM: CT ABDOMEN AND PELVIS WITH CONTRAST TECHNIQUE: Multidetector CT imaging of the abdomen and pelvis was performed using the standard protocol following bolus administration of intravenous contrast. RADIATION DOSE REDUCTION: This exam was performed according to the departmental dose-optimization program which includes automated exposure control, adjustment of the mA and/or kV according to patient size and/or use of iterative reconstruction technique. CONTRAST:  100mL OMNIPAQUE IOHEXOL 300 MG/ML  SOLN COMPARISON:  None Available. FINDINGS: Lower chest: No acute abnormality. Hepatobiliary: Wedge-shaped hypodense area along the falciform ligament for instance on image 28/2 is in region commonly reflecting focal fatty infiltration or altered perfusion (veins of Sappey). Gallbladder is unremarkable. No biliary ductal dilation. Pancreas: No pancreatic ductal dilation or evidence of acute inflammation. Spleen: No splenomegaly or focal splenic lesion. Adrenals/Urinary Tract: Bilateral adrenal glands appear normal. No hydronephrosis. Kidneys demonstrate symmetric enhancement and excretion of contrast material. 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 Urinary bladder is unremarkable for degree of distension. Stomach/Bowel: No radiopaque enteric contrast material was administered. Stomach is unremarkable for  degree of distension. No pathologic dilation of large or small bowel. The appendix is mildly dilated with some adjacent inflammatory stranding measuring up to 7.5 mm in diameter on sagittal image 39/6 with adjacent inflammation. No appendicoliths identified. Vascular/Lymphatic: Normal caliber abdominal aorta. No pathologically enlarged abdominal or pelvic lymph nodes. Reproductive: Pelvic reproductive structures are normal in CT appearance for a reproductive age female. Other: No walled off fluid collections. Mild stranding in the right lower quadrant. No pneumoperitoneum. Musculoskeletal: No acute osseous abnormality. IMPRESSION: CT findings most consistent with early acute uncomplicated appendicitis. Electronically Signed   By: Maudry MayhewJeffrey  Waltz M.D.   On: 01/16/2022 15:10   ? ?Procedures ?Procedures  ? ?Medications Ordered in ED ?Medications  ?cefTRIAXone (ROCEPHIN) 2 g in sodium chloride 0.9 % 100 mL IVPB (2 g Intravenous New Bag/Given (Non-Interop) 01/16/22 1747)  ?  And  ?metroNIDAZOLE (FLAGYL) IVPB 500 mg (500 mg Intravenous New Bag/Given 01/16/22 1747)  ?HYDROmorphone (DILAUDID) injection 1 mg (has no administration in time range)  ?sodium chloride 0.9 % bolus 1,000 mL (1,000 mLs Intravenous New Bag/Given 01/16/22 1716)  ?morphine (PF) 4 MG/ML injection 4 mg (4 mg Intravenous Given 01/16/22 1717)  ?ondansetron [facility]) injection 4 mg (4 mg Intravenous Given 01/16/22 1720)  ? ? ?ED Course/ Medical Decision Making/ A&P ?  ?                        ?Medical Decision Making ?Amount and/or Complexity of Data Reviewed ?Labs: ordered. ? ?Risk ?Prescription drug management. ? ?This patient presents to the ED for concern of abdominal pain, this involves an extensive number of treatment options, and is a complaint that carries with it a high risk of complications and morbidity.  The differential diagnosis includes acute hepatobiliary disease, pancreatitis, appendicitis, PUD, gastritis, bowel obstruction, ectopic, diverticulitis,  etc.  ? ?Co morbidities that complicate the patient evaluation ?Previous C-section ? ?Additional history obtained:  ?Additional history obtained from: none  ?External records from outside source obtained and r

## 2022-01-16 NOTE — Anesthesia Procedure Notes (Signed)
Procedure Name: Intubation ?Date/Time: 01/16/2022 8:28 PM ?Performed by: Gerald Leitz, CRNA ?Pre-anesthesia Checklist: Patient identified, Patient being monitored, Timeout performed, Emergency Drugs available and Suction available ?Patient Re-evaluated:Patient Re-evaluated prior to induction ?Oxygen Delivery Method: Circle system utilized ?Preoxygenation: Pre-oxygenation with 100% oxygen ?Induction Type: IV induction ?Ventilation: Mask ventilation without difficulty ?Laryngoscope Size: Mac and 3 ?Grade View: Grade I ?Tube type: Oral ?Tube size: 7.0 mm ?Number of attempts: 1 ?Placement Confirmation: ETT inserted through vocal cords under direct vision, positive ETCO2 and breath sounds checked- equal and bilateral ?Secured at: 21 cm ?Tube secured with: Tape ?Dental Injury: Teeth and Oropharynx as per pre-operative assessment  ? ? ? ? ?

## 2022-01-16 NOTE — H&P (Signed)
? ?Taylor Yu ?10/24/1985  ?[id number].   ? ?Chief Complaint/Reason for Consult: acute appendicitis ? ?HPI:  ?[name] is a 36 yo female who presented today with acute abdominal pain. She began having pain early this morning, and was seen in urgent care.  She had nausea and an episode of vomiting while there. Labs were significant for WBC of 16.  A CT scan showed early acute appendicitis without perforation. Prior abdominal surgeries include a C section 3 years ago. She is in good health. ? ?ROS: ?Review of Systems  ?Constitutional:  Negative for chills and fever.  ?HENT:  Negative for sore throat.   ?Respiratory:  Negative for shortness of breath, wheezing and stridor.   ?Gastrointestinal:  Positive for abdominal pain, nausea and vomiting.  ?Skin:  Negative for rash.  ?Neurological:  Negative for seizures and weakness.  ? ?Family History  ?Problem Relation Age of Onset  ? Multiple sclerosis Mother   ? ADD / ADHD Sister   ? Heart defect Brother   ? Cancer Maternal Aunt   ? Ovarian cancer Maternal Aunt   ? Cancer Maternal Grandmother   ? Alzheimer's disease Paternal Grandmother   ? Stroke Paternal Grandmother   ? ? ?Past Medical History:  ?Diagnosis Date  ? ADHD   ? Gestational diabetes   ? History of COVID-19 03/28/2021  ? Polyhydramnios affecting pregnancy   ? Vaginal Pap smear, abnormal   ? ? ?Past Surgical History:  ?Procedure Laterality Date  ? BREAST REDUCTION SURGERY    ? CESAREAN SECTION    ? COLPOSCOPY    ? DILATION AND EVACUATION N/A 06/01/2021  ? Procedure: DILATATION AND EVACUATION;  Surgeon: Hermina StaggersErvin, Michael L, MD;  Location: [facility];  Service: Gynecology;  Laterality: N/A;  ? LEEP    ? OPERATIVE ULTRASOUND N/A 06/01/2021  ? Procedure: OPERATIVE ULTRASOUND;  Surgeon: Hermina StaggersErvin, Michael L, MD;  Location: [facility];  Service: Gynecology;  Laterality: N/A;  ? WISDOM TOOTH EXTRACTION    ? ? ?Social History:  reports that she has never smoked. She has never used smokeless tobacco.  She reports that she does not currently use alcohol. She reports that she does not use drugs. ? ?Allergies:  ?Allergies  ?Allergen Reactions  ? Meperidine Itching and Rash  ? ? ?(Not in a hospital admission) ? ? ? ?Physical Exam: ?Blood pressure 109/72, pulse 90, temperature 98.4 ?F (36.9 ?C), temperature source Oral, resp. rate 18, height 5\' 6"  (1.676 m), weight 78.9 kg, last menstrual period 01/04/2022, SpO2 98 %, unknown if currently breastfeeding. ?General: resting comfortably, appears stated age, no apparent distress ?Neurological: alert and oriented, no focal deficits, cranial nerves grossly in tact ?HEENT: normocephalic, atraumatic, oropharynx clear, no scleral icterus ?CV: regular rate and rhythm, extremities warm and well-perfused ?Respiratory: normal work of breathing on room air, symmetric chest wall expansion ?Abdomen: soft, nondistended, focally tender to palpation in the RLQ. ?Extremities: warm and well-perfused, no deformities, moving all extremities spontaneously ?Psychiatric: normal mood and affect ?Skin: warm and dry, no jaundice, no rashes or lesions ? ? ?Results for orders placed or performed during the hospital encounter of 01/16/22 (from the past 48 hour(s))  ?Lipase, blood     Status: None  ? Collection Time: 01/16/22  4:47 PM  ?Result Value Ref Range  ? Lipase 25 11 - 51 U/L  ?  Comment: Performed at [facility], [address] [place], [place] [id number]  ?Comprehensive metabolic panel  Status: Abnormal  ? Collection Time: 01/16/22  4:47 PM  ?Result Value Ref Range  ? Sodium 135 135 - 145 mmol/L  ? Potassium 3.4 (L) 3.5 - 5.1 mmol/L  ? Chloride 100 98 - 111 mmol/L  ? CO2 27 22 - 32 mmol/L  ? Glucose, Bld 144 (H) 70 - 99 mg/dL  ?  Comment: Glucose reference range applies only to samples taken after fasting for at least 8 hours.  ? BUN 12 6 - 20 mg/dL  ? Creatinine, Ser 0.68 0.44 - 1.00 mg/dL  ? Calcium 8.9 8.9 - 10.3 mg/dL  ? Total Protein 7.3 6.5 - 8.1 g/dL  ? Albumin 4.1 3.5  - 5.0 g/dL  ? AST 19 15 - 41 U/L  ? ALT 16 0 - 44 U/L  ? Alkaline Phosphatase 61 38 - 126 U/L  ? Total Bilirubin 0.8 0.3 - 1.2 mg/dL  ? GFR, Estimated >60 >60 mL/min  ?  Comment: (NOTE) ?Calculated using the CKD-EPI Creatinine Equation (2021) ?  ? Anion gap 8 5 - 15  ?  Comment: Performed at [facility], [address], [place], [place] [address]  ?CBC     Status: Abnormal  ? Collection Time: 01/16/22  4:47 PM  ?Result Value Ref Range  ? WBC 16.1 (H) 4.0 - 10.5 K/uL  ? RBC 3.93 3.87 - 5.11 MIL/uL  ? Hemoglobin 12.1 12.0 - 15.0 g/dL  ? HCT 35.2 (L) 36.0 - 46.0 %  ? MCV 89.6 80.0 - 100.0 fL  ? MCH 30.8 26.0 - 34.0 pg  ? MCHC 34.4 30.0 - 36.0 g/dL  ? RDW 11.9 11.5 - 15.5 %  ? Platelets 305 150 - 400 K/uL  ? nRBC 0.0 0.0 - 0.2 %  ?  Comment: Performed at [facility], [address], [place], [place] [address]  ?Urinalysis, Routine w reflex microscopic Urine, Clean Catch     Status: None  ? Collection Time: 01/16/22  5:06 PM  ?Result Value Ref Range  ? Color, Urine YELLOW YELLOW  ? APPearance CLEAR CLEAR  ? Specific Gravity, Urine <=1.005 1.005 - 1.030  ? pH 6.0 5.0 - 8.0  ? Glucose, UA NEGATIVE NEGATIVE mg/dL  ? Hgb urine dipstick NEGATIVE NEGATIVE  ? Bilirubin Urine NEGATIVE NEGATIVE  ? Ketones, ur NEGATIVE NEGATIVE mg/dL  ? Protein, ur NEGATIVE NEGATIVE mg/dL  ? Nitrite NEGATIVE NEGATIVE  ? Leukocytes,Ua NEGATIVE NEGATIVE  ?  Comment: Microscopic not done on urines with negative protein, blood, leukocytes, nitrite, or glucose < 500 mg/dL. ?Performed at [facility], [address] [place], [place] [address] ?  ?Pregnancy, urine     Status: None  ? Collection Time: 01/16/22  5:06 PM  ?Result Value Ref Range  ? Preg Test, Ur NEGATIVE NEGATIVE  ?  Comment:        ?THE SENSITIVITY OF THIS ?METHODOLOGY IS >20 mIU/mL. ?Performed at [facility], [address] [place], [place] [address] ?  ? ?CT ABDOMEN PELVIS W CONTRAST ? ?Result Date: 01/16/2022 ?CLINICAL DATA:  Right lower  quadrant abdominal pain with nausea since this morning. EXAM: CT ABDOMEN AND PELVIS WITH CONTRAST TECHNIQUE: Multidetector CT imaging of the abdomen and pelvis was performed using the standard protocol following bolus administration of intravenous contrast. RADIATION DOSE REDUCTION: This exam was performed according to the departmental dose-optimization program which includes automated exposure control, adjustment of the mA and/or kV according to patient size and/or  use of iterative reconstruction technique. CONTRAST:  OMNIPAQUE IOHEXOL 300 MG/ML  SOLN COMPARISON:  None Available. FINDINGS: Lower chest: No acute abnormality. Hepatobiliary: Wedge-shaped hypodense area along the falciform ligament for instance on image 28/2 is in region commonly reflecting focal fatty infiltration or altered perfusion (veins of Sappey). Gallbladder is unremarkable. No biliary ductal dilation. Pancreas: No pancreatic ductal dilation or evidence of acute inflammation. Spleen: No splenomegaly or focal splenic lesion. Adrenals/Urinary Tract: Bilateral adrenal glands appear normal. No hydronephrosis. Kidneys demonstrate symmetric enhancement and excretion of contrast material. 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 Urinary bladder is unremarkable for degree of distension. Stomach/Bowel: No radiopaque enteric contrast material was administered. Stomach is unremarkable for degree of distension. No pathologic dilation of large or small bowel. The appendix is mildly dilated with some adjacent inflammatory stranding measuring up to 7.5 mm in diameter on sagittal image 39/6 with adjacent inflammation. No appendicoliths identified. Vascular/Lymphatic: Normal caliber abdominal aorta. No pathologically enlarged abdominal or pelvic lymph nodes. Reproductive: Pelvic reproductive structures are normal in CT appearance for a reproductive age female. Other: No walled off fluid collections. Mild stranding in the right  lower quadrant. No pneumoperitoneum. Musculoskeletal: No acute osseous abnormality. IMPRESSION: CT findings most consistent with early acute uncomplicated appendicitis. Electronically Signed   By: Tinnie Gens

## 2022-01-16 NOTE — ED Provider Notes (Signed)
?[facility] ? ? ? ?CSN: [id number] ?Arrival date & time: 01/16/22  1303 ? ? ?  ? ?History   ?Chief Complaint ?Chief Complaint  ?Patient presents with  ? Abdominal Pain  ? Nausea  ? ? ?HPI ?Taylor Yu is a 36 y.o. female.  ? ?Pleasant 36 year old female presents today with complaint of abdominal pain and nausea that started abruptly at 9 AM this morning.  She went to an urgent care in [facility] and states she had a pregnancy test performed which was negative, and was told to head to the emergency room due to suspected ovarian torsion versus appendicitis.  Patient states she went to the emergency room, but due to the long wait time, decided to come here instead.  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  She had a normal bowel movement last night, and none yet today.  She denies any change or discomfort with urination.  She states that she and her daughter ate an omelette this morning, and her daughter vomited it all out.  Patient has not had a fever.  She has not had vomiting.  She does feel very queasy however and states she had 4 mg of Zofran at the prior urgent care which has not helped.  She denies any vaginal discharge.  She denies any heartburn, bloating, gas, or abdominal distention. ? ? ?Abdominal Pain ?Associated symptoms: nausea   ?Associated symptoms: no constipation, no diarrhea, no fever and no vomiting   ? ?Past Medical History:  ?Diagnosis Date  ? ADHD   ? Gestational diabetes   ? History of COVID-19 03/28/2021  ? Polyhydramnios affecting pregnancy   ? Vaginal Pap smear, abnormal   ? ? ?Patient Active Problem List  ? Diagnosis Date Noted  ? 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 Anxiety 05/29/2018  ? FH: congenital heart problem 08/20/2017  ? History of bilateral breast reduction surgery 08/20/2017  ? ADHD 05/16/2015  ? ? ?Past Surgical History:  ?Procedure  Laterality Date  ? BREAST REDUCTION SURGERY    ? CESAREAN SECTION    ? COLPOSCOPY    ? DILATION AND EVACUATION N/A 06/01/2021  ? Procedure: DILATATION AND EVACUATION;  Surgeon: Hermina Staggers, MD;  Location: [facility];  Service: Gynecology;  Laterality: N/A;  ? LEEP    ? OPERATIVE ULTRASOUND N/A 06/01/2021  ? Procedure: OPERATIVE ULTRASOUND;  Surgeon: Hermina Staggers, MD;  Location: [facility];  Service: Gynecology;  Laterality: N/A;  ? WISDOM TOOTH EXTRACTION    ? ? ?OB History   ? ? Gravida  ?2  ? Para  ?1  ? Term  ?1  ? Preterm  ?   ? AB  ?1  ? Living  ?1  ?  ? ? SAB  ?   ? IAB  ?   ? Ectopic  ?   ? Multiple  ?   ? Live Births  ?   ?   ?  ?  ? ? ? ?Home Medications   ? ?Prior to Admission medications   ?Medication Sig Start Date End Date Taking? Authorizing Provider  ?amphetamine-dextroamphetamine (ADDERALL XR) 30 MG 24 hr capsule Take 1 capsule (30 mg total) by mouth every morning. 11/27/21   Everrett Coombe, DO  ?amphetamine-dextroamphetamine (ADDERALL) 10 MG tablet Take 1 tablet (10 mg total) by mouth daily. 11/27/21  Everrett Coombe, DO  ?FLUoxetine (PROZAC) 20 MG capsule Take 1 capsule (20 mg total) by mouth daily. 11/27/21   Everrett Coombe, DO  ?levothyroxine (SYNTHROID) 100 MCG tablet Take 1 tablet (100 mcg total) by mouth daily before breakfast. 07/03/21   Milas Hock, MD  ? ? ?Family History ?Family History  ?Problem Relation Age of Onset  ? Multiple sclerosis Mother   ? ADD / ADHD Sister   ? Heart defect Brother   ? Cancer Maternal Aunt   ? Ovarian cancer Maternal Aunt   ? Cancer Maternal Grandmother   ? Alzheimer's disease Paternal Grandmother   ? Stroke Paternal Grandmother   ? ? ?Social History ?Social History  ? ?Tobacco Use  ? Smoking status: Never  ? Smokeless tobacco: Never  ?Substance Use Topics  ? Alcohol use: Not Currently  ? Drug use: Never  ? ? ? ?Allergies   ?Meperidine ? ? ?Review of Systems ?Review of Systems  ?Constitutional:  Negative for fever.   ?Gastrointestinal:  Positive for abdominal pain and nausea. Negative for abdominal distention, constipation, diarrhea and vomiting.  ?All other systems reviewed and are negative. ? ? ?Physical Exam ?Triage Vital Signs ?ED Triage Vitals  ?Enc Vitals Group  ?   BP 01/16/22 1315 110/74  ?   Pulse Rate 01/16/22 1315 86  ?   Resp 01/16/22 1315 18  ?   Temp 01/16/22 1315 97.8 ?F (36.6 ?C)  ?   Temp Source 01/16/22 1315 Oral  ?   SpO2 01/16/22 1315 100 %  ?   Weight --   ?   Height --   ?   Head Circumference --   ?   Peak Flow --   ?   Pain Score 01/16/22 1317 8  ?   Pain Loc --   ?   Pain Edu? --   ?   Excl. in GC? --   ? ?No data found. ? ?Updated Vital Signs ?BP 110/74 (BP Location: Left Arm)   Pulse 86   Temp 97.8 ?F (36.6 ?C) (Oral)   Resp 18   LMP 01/04/2022 (Exact Date)   SpO2 100%  ? ?Visual Acuity ?Right Eye Distance:   ?Left Eye Distance:   ?Bilateral Distance:   ? ?Right Eye Near:   ?Left Eye Near:    ?Bilateral Near:    ? ?Physical Exam ?Vitals and nursing note reviewed.  ?Constitutional:   ?   Appearance: She is well-developed. She is not toxic-appearing or diaphoretic.  ?   Comments: Uncomfortably lying on bed, but no distress or toxicity  ?HENT:  ?   Head: Normocephalic and atraumatic.  ?   Mouth/Throat:  ?   Pharynx: No pharyngeal swelling or oropharyngeal exudate.  ?Eyes:  ?   General: No scleral icterus. ?   Extraocular Movements: Extraocular movements intact.  ?   Pupils: Pupils are equal, round, and reactive to light.  ?Cardiovascular:  ?   Rate and Rhythm: Normal rate and regular rhythm.  ?   Heart sounds: Normal heart sounds. No murmur heard. ?Pulmonary:  ?   Effort: Pulmonary effort is normal. No respiratory distress.  ?   Breath sounds: Normal breath sounds. No stridor. No wheezing or rhonchi.  ?Abdominal:  ?   General: Abdomen is flat. Bowel sounds are normal. There is no distension.  ?   Palpations: Abdomen is soft. There is no hepatomegaly, splenomegaly or mass.  ?   Tenderness: There is  abdominal tenderness in the right lower  quadrant. There is guarding and rebound. Positive signs include McBurney's sign. Negative signs include Murphy's sign and Rovsing's sign.  ?   Hernia: No hernia is present.  ?Genitourinary: ?   Comments: deferred ?Skin: ?   General: Skin is warm.  ?   Capillary Refill: Capillary refill takes less than 2 seconds.  ?   Coloration: Skin is not cyanotic or pale.  ?   Findings: No rash.  ?Neurological:  ?   General: No focal deficit present.  ?   Mental Status: She is alert and oriented to person, place, and time.  ?Psychiatric:     ?   Mood and Affect: Mood normal.     ?   Behavior: Behavior normal.  ? ? ? ?UC Treatments / Results  ?Labs ?(all labs ordered are listed, but only abnormal results are displayed) ?Labs Reviewed - No data to display ? ?EKG ? ? ?Radiology ?CT ABDOMEN PELVIS W CONTRAST ? ?Result Date: 01/16/2022 ?CLINICAL DATA:  Right lower quadrant abdominal pain with nausea since this morning. EXAM: CT ABDOMEN AND PELVIS WITH CONTRAST TECHNIQUE: Multidetector CT imaging of the abdomen and pelvis was performed using the standard protocol following bolus administration of intravenous contrast. RADIATION DOSE REDUCTION: This exam was performed according to the departmental dose-optimization program which includes automated exposure control, adjustment of the mA and/or kV according to patient size and/or use of iterative reconstruction technique. CONTRAST:  100mL OMNIPAQUE IOHEXOL 300 MG/ML  SOLN COMPARISON:  None Available. FINDINGS: Lower chest: No acute abnormality. Hepatobiliary: Wedge-shaped hypodense area along the falciform ligament for instance on image 28/2 is in region commonly reflecting focal fatty infiltration or altered perfusion (veins of Sappey). Gallbladder is unremarkable. No biliary ductal dilation. Pancreas: No pancreatic ductal dilation or evidence of acute inflammation. Spleen: No splenomegaly or focal splenic lesion. Adrenals/Urinary Tract: Bilateral  adrenal glands appear normal. No hydronephrosis. Kidneys demonstrate symmetric enhancement and excretion of contrast material. 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 Urinary bladder is unremarkable f

## 2022-01-16 NOTE — ED Notes (Signed)
[facility] reports they are en route to transport pt to [facility]. Pt/family updated on plan of care. All questions/concerns addressed at this time.  ?

## 2022-01-16 NOTE — Op Note (Signed)
Date: 01/16/22 ? ?Patient: Taylor Yu ?MRN: [id number] ? ?Preoperative Diagnosis: Acute appendicitis ?Postoperative Diagnosis: Same ? ?Procedure: Laparoscopic appendectomy ? ?Surgeon: Sophronia Simas, MD ? ?EBL: Minimal ? ?Anesthesia: General endotracheal ? ?Specimens: Appendix ? ?Indications: [name] is a 36 yo woman presented to the ED with abdominal pain that began this morning, and localized to the right lower quadrant.  A CT scan showed early acute appendicitis without perforation.  After a discussion of the risks and benefits of surgery, she agreed to proceed with appendectomy. ? ?Findings: Acute appendicitis without perforation or abscess. ? ?Procedure details: Informed consent was obtained in the preoperative area prior to the procedure. The patient was brought to the operating room and placed on the table in the supine position.  General anesthesia was induced and appropriate lines and drains were placed for intraoperative monitoring. Perioperative antibiotics were administered per SCIP guidelines. The abdomen was prepped and draped in the usual sterile fashion. A pre-procedure timeout was taken verifying patient identity, surgical site and procedure to be performed. ? ?A small infraumbilical skin incision was made and the subcutaneous tissue was spread to expose the fascia. The umbilical stalk was grasped and elevated, and the fascia was sharply incised. The peritoneal cavity was visualized and a 79mm Hasson trocar was inserted. The abdomen was inspected with no evidence of visceral or vascular injury. A suprapubic 44mm port was placed, followed by a 89mm port in the LLQ, both under direct visualization. The cecum and subsequently the appendix were identified in the RLQ.  The distal half of the appendix appeared dilated and inflamed, consistent with acute appendicitis.  There was no evidence of perforation or abscess.  The base of the appendix was healthy in appearance.  The tip of the appendix was mildly  adherent to the terminal ileum, and these inflammatory adhesions were gently broken up using blunt dissection.  The mesoappendix was then divided close to the appendix using harmonic shears, starting at the tip of the appendix and working towards the base.  The cecum was adherent to the right pelvic sidewall, and these adhesions were taken down using sharp dissection to allow enough mobility to staple the appendix.  The appendix was then divided at the base from the cecum using a 73mm stapler with a blue load. The specimen was placed in an endocatch bag and removed and sent for routine pathology. The surgical site was irrigated. The staple line was inspected and appeared in tact with no bleeding or leakage. The ports were removed and the pneumoperitoneum was evacuated. The umbilical port site fascia was closed with an 0 Vicryl suture. The skin at all port sites was closed with 4-0 monocryl subcuticular suture. Dermabond was applied.  ? ?The patient tolerated the procedure well with no apparent complications.  All counts were correct x2 at the end of the procedure. The patient was extubated and taken to PACU in stable condition. ? ?Sophronia Simas, MD ?01/16/22 ?9:41 PM ? ? ?

## 2022-01-16 NOTE — ED Triage Notes (Signed)
Pt sent from UC for appendicitis  ?Reports RLQ pain that started at 0900 ?Reports Nausea and vomiting ?Hypotensive at triage ?

## 2022-01-17 ENCOUNTER — Encounter (HOSPITAL_COMMUNITY): Payer: Self-pay | Admitting: Surgery

## 2022-01-17 ENCOUNTER — Other Ambulatory Visit: Payer: Self-pay

## 2022-01-17 MED ORDER — ACETAMINOPHEN 500 MG PO TABS: 1000.0000 mg | ORAL_TABLET | Freq: Three times a day (TID) | ORAL | 0 refills | Status: AC | PRN

## 2022-01-17 MED ORDER — AMPHETAMINE-DEXTROAMPHET ER 10 MG PO CP24
30.0000 mg | ORAL_CAPSULE | Freq: Every day | ORAL | Status: DC
Start: 2022-01-17 — End: 2022-01-17
  Administered 2022-01-17: 30 mg via ORAL
  Filled 2022-01-17: qty 3

## 2022-01-17 MED ORDER — OXYCODONE HCL 5 MG PO TABS
5.0000 mg | ORAL_TABLET | Freq: Once | ORAL | Status: AC
  Administered 2022-01-17: 5 mg via ORAL
  Filled 2022-01-17: qty 1

## 2022-01-17 MED ORDER — OXYCODONE HCL 5 MG PO TABS
5.0000 mg | ORAL_TABLET | Freq: Four times a day (QID) | ORAL | 0 refills | Status: DC | PRN
Start: 2022-01-17 — End: 2022-05-31

## 2022-01-17 NOTE — Discharge Instructions (Signed)
[facility] ? ?Please arrive at least 30 min before your appointment to complete your check in paperwork.  If you are unable to arrive 30 min prior to your appointment time we may have to cancel or reschedule you. ?LAPAROSCOPIC SURGERY: POST OP INSTRUCTIONS ?Always review your discharge instruction sheet given to you by the facility where your surgery was performed. ?IF YOU HAVE DISABILITY OR FAMILY LEAVE FORMS, YOU MUST BRING THEM TO THE OFFICE FOR PROCESSING.   ?DO NOT GIVE THEM TO YOUR DOCTOR. ? ?PAIN CONTROL ? ?First take acetaminophen (Tylenol) AND/or ibuprofen (Advil) to control your pain after surgery.  Follow directions on package.  Taking acetaminophen (Tylenol) and/or ibuprofen (Advil) regularly after surgery will help to control your pain and lower the amount of prescription pain medication you may need.  You should not take more than 3,000 mg (4 grams) of acetaminophen (Tylenol) in 24 hours.  You should not take ibuprofen (Advil), aleve, motrin, naprosyn or other NSAIDS if you have a history of stomach ulcers or chronic kidney disease.  ?A prescription for pain medication may be given to you upon discharge.  Take your pain medication as prescribed, if you still have uncontrolled pain after taking acetaminophen (Tylenol) or ibuprofen (Advil). ?Use ice packs to help control pain. ?If you need a refill on your pain medication, please contact your pharmacy.  They will contact our office to request authorization. Prescriptions will not be filled after 5pm or on week-ends. ? ?HOME MEDICATIONS ?Take your usually prescribed medications unless otherwise directed. ? ?DIET ?You should follow a light diet the first few days after arrival home.  Be sure to include lots of fluids daily. Avoid fatty, fried foods.  ? ?CONSTIPATION ?It is common to experience some constipation after surgery and if you are taking pain medication.  Increasing fluid intake and taking a stool softener (such as Colace)  will usually help or prevent this problem from occurring.  A mild laxative (Milk of Magnesia or Miralax) should be taken according to package instructions if there are no bowel movements after 48 hours. ? ?WOUND/INCISION CARE ?Most patients will experience some swelling and bruising in the area of the incisions.  Ice packs will help.  Swelling and bruising can take several days to resolve.  ?Unless discharge instructions indicate otherwise, follow guidelines below    ?DERMABOND/SKIN GLUE - you may shower in 24 hours.  The glue will flake off over the next 2-3 weeks. ? ?ACTIVITIES ?You may resume regular (light) daily activities beginning the next day--such as daily self-care, walking, climbing stairs--gradually increasing activities as tolerated.  You may have sexual intercourse when it is comfortable.  Refrain from any heavy lifting or straining until approved by your doctor. Do not lift anything greater than 15lbs for 2 weeks after surgery and nothing greater than 40lbs for 4 additional weeks (6 weeks total) after surgery.  ?You may drive when you are no longer taking prescription pain medication, you can comfortably wear a seatbelt, and you can safely maneuver your car and apply brakes. ? ?FOLLOW-UP ?You should see your doctor in the office for a follow-up appointment approximately 2-3 weeks after your surgery.  You should have been given your post-op/follow-up appointment when your surgery was scheduled.  If you did not receive a post-op/follow-up appointment, make sure that you call for this appointment within a day or two after you arrive home to insure a convenient appointment time. ? ? ?WHEN TO CALL YOUR DOCTOR: ?Fever over 101.0 ?  Inability to urinate ?Continued bleeding from incision. ?Increased pain, redness, or drainage from the incision. ?Increasing abdominal pain ? ?The clinic staff is available to answer your questions during regular business hours.  Please don?t hesitate to call and ask to speak to  one of the nurses for clinical concerns.  If you have a medical emergency, go to the nearest emergency room or call 911.  A surgeon from [facility] is always on call at the hospital. ?[address], [address], [place], [place]  [address] ? [address] [address], [place], [place]   [address] ?[phone] ? [phone] ? FAX [phone] ? ? ?

## 2022-01-17 NOTE — Progress Notes (Signed)
Transition of Care (TOC) Screening Note ? ?Patient Details  ?Name: Taylor Yu ?Date of Birth: 1986/02/21 ? ?Transition of Care (TOC) CM/SW Contact:    ?Ewing Schlein, LCSW ?Phone Number: ?01/17/2022, 11:41 AM ? ?Transition of Care Department [facility]) has reviewed patient and no TOC needs have been identified at this time. We will continue to monitor patient advancement through interdisciplinary progression rounds. If new patient transition needs arise, please place a TOC consult. ?

## 2022-01-17 NOTE — Progress Notes (Signed)
Discharge instructions discussed with patient and family, verbalized agreement and understanding 

## 2022-01-17 NOTE — Discharge Summary (Signed)
? ? ?  Patient ID: ?Taylor Yu ?[id number] ?28-Nov-1985 36 y.o. ? ?Admit date: 01/16/2022 ?Discharge date: 01/17/2022 ? ?Admitting Diagnosis: ?Acute Appendicitis  ? ?Discharge Diagnosis ?Acute Appendictiis  ? ?Consultants ?None ? ?H&P: ?Taylor Yu is a 36 yo female who presented today with acute abdominal pain. She began having pain early this morning, and was seen in urgent care.  She had nausea and an episode of vomiting while there. Labs were significant for WBC of 16.  A CT scan showed early acute appendicitis without perforation. Prior abdominal surgeries include a C section 3 years ago. She is in good health. ? ?Procedures ?Dr. Freida Busman - Laparoscopic Appendectomy - 01/16/2022 ? ?Hospital Course:  ?The patient was admitted and underwent a laparoscopic appendectomy.  The patient tolerated the procedure well.  On POD 1, the patient was tolerating a regular diet, voiding well, mobilizing, and pain was controlled with oral pain medications. The patient was stable for DC home. A not was provided for work. Discharge instructions, restrictions and precautions were discussed. Follow-up as noted below.  ? ?To note, patient inquired about her plans to drive to [place] this weekend for a wedding. Discussed increased risk DVT/PE given recent surgery and prolonged immobilization. Advised if she is to go to take frequent stops to mobilize. Also discussed with her mother and her husband.  ? ?Physical Exam: ?Gen:  Alert, NAD, pleasant ?Card:  RRR ?Pulm:  CTAB, no W/R/R, effort normal ?Abd: Soft, appropriately tender, ND, +BS. Incisions with glue intact appears well and are without drainage, bleeding, or signs of infection ?Ext:  No LE edema  ?Psych: A&Ox3  ? ? ?Allergies as of 01/17/2022   ? ?   Reactions  ? Meperidine Itching, Rash  ? ?  ? ?  ?Medication List  ?  ? ?TAKE these medications   ? ?acetaminophen 500 MG tablet ?Commonly known as: TYLENOL ?Take 2 tablets (1,000 mg total) by mouth every 8 (eight) hours as needed. ?What changed:   ?when to take this ?reasons to take this ?  ?amphetamine-dextroamphetamine 30 MG 24 hr capsule ?Commonly known as: Adderall XR ?Take 1 capsule (30 mg total) by mouth every morning. ?  ?amphetamine-dextroamphetamine 10 MG tablet ?Commonly known as: ADDERALL ?Take 1 tablet (10 mg total) by mouth daily. ?  ?FLUoxetine 20 MG capsule ?Commonly known as: PROZAC ?Take 1 capsule (20 mg total) by mouth daily. ?  ?levothyroxine 100 MCG tablet ?Commonly known as: Synthroid ?Take 1 tablet (100 mcg total) by mouth daily before breakfast. ?  ?oxyCODONE 5 MG immediate release tablet ?Commonly known as: Roxicodone ?Take 1 tablet (5 mg total) by mouth every 6 (six) hours as needed for breakthrough pain (breakthrough pain). ?  ?prenatal multivitamin Tabs tablet ?Take 1 tablet by mouth daily at 12 noon. ?  ? ?  ? ? ? ? Follow-up Information   ? ? [facility].   ?Specialty: General Surgery ?Why: Please call to confirm your appointment date and time. We are working hard to make this for you.  Please bring a copy of your photo ID and insurance card.  Please arrive 30 minutes prior to appointment for paperwork. ?Contact information: ?[address] ?[address] ?[place] [address] ?[phone] ? ? ?  ?  ? ?  ?  ? ?  ? ? ?Signed: ?Leary Roca, PA-C ?[facility] ?01/17/2022, 10:19 AM ?Please see Amion for pager number during day hours 7:00am-4:30pm ? ?

## 2022-01-18 LAB — SURGICAL PATHOLOGY

## 2022-01-18 NOTE — Anesthesia Postprocedure Evaluation (Signed)
Anesthesia Post Note ? ?Patient: Avarey Yaeger ? ?Procedure(s) Performed: APPENDECTOMY LAPAROSCOPIC (Abdomen) ? ?  ? ?Patient location during evaluation: PACU ?Anesthesia Type: General ?Level of consciousness: awake and alert ?Pain management: pain level controlled ?Vital Signs Assessment: post-procedure vital signs reviewed and stable ?Respiratory status: spontaneous breathing, nonlabored ventilation, respiratory function stable and patient connected to nasal cannula oxygen ?Cardiovascular status: blood pressure returned to baseline and stable ?Postop Assessment: no apparent nausea or vomiting ?Anesthetic complications: no ? ? ?No notable events documented. ? ?Last Vitals:  ?Vitals:  ? 01/17/22 0200 01/17/22 0614  ?BP: 103/71 100/62  ?Pulse: 90 75  ?Resp: 16 16  ?Temp: 36.6 ?C 36.7 ?C  ?SpO2: 95% 96%  ?  ?Last Pain:  ?Vitals:  ? 01/17/22 0614  ?TempSrc: Oral  ?PainSc:   ? ? ?  ?  ?  ?  ?  ?  ? ?Rhandi Despain ? ? ? ? ?

## 2022-01-24 ENCOUNTER — Other Ambulatory Visit: Payer: Self-pay | Admitting: Obstetrics and Gynecology

## 2022-01-24 DIAGNOSIS — E039 Hypothyroidism, unspecified: Secondary | ICD-10-CM

## 2022-03-05 ENCOUNTER — Other Ambulatory Visit: Payer: Self-pay | Admitting: Family Medicine

## 2022-03-06 MED ORDER — AMPHETAMINE-DEXTROAMPHET ER 30 MG PO CP24: 30.0000 mg | ORAL_CAPSULE | Freq: Every morning | ORAL | 0 refills | Status: DC

## 2022-03-06 MED ORDER — AMPHETAMINE-DEXTROAMPHETAMINE 10 MG PO TABS: 10.0000 mg | ORAL_TABLET | Freq: Every day | ORAL | 0 refills | Status: DC

## 2022-03-15 ENCOUNTER — Encounter (INDEPENDENT_AMBULATORY_CARE_PROVIDER_SITE_OTHER): Payer: 59 | Admitting: Family Medicine

## 2022-03-15 DIAGNOSIS — F900 Attention-deficit hyperactivity disorder, predominantly inattentive type: Secondary | ICD-10-CM

## 2022-03-15 MED ORDER — ADDERALL XR 30 MG PO CP24: 30.0000 mg | ORAL_CAPSULE | Freq: Every day | ORAL | 0 refills | Status: DC

## 2022-03-15 NOTE — Telephone Encounter (Signed)
I spent 5 total minutes of online digital evaluation and management services in this patient-initiated request for online care. 

## 2022-04-10 ENCOUNTER — Other Ambulatory Visit: Payer: Self-pay | Admitting: Family Medicine

## 2022-04-10 ENCOUNTER — Other Ambulatory Visit: Payer: Self-pay | Admitting: *Deleted

## 2022-04-10 DIAGNOSIS — E039 Hypothyroidism, unspecified: Secondary | ICD-10-CM

## 2022-04-10 DIAGNOSIS — F900 Attention-deficit hyperactivity disorder, predominantly inattentive type: Secondary | ICD-10-CM

## 2022-04-10 MED ORDER — ADDERALL XR 30 MG PO CP24: 30.0000 mg | ORAL_CAPSULE | Freq: Every day | ORAL | 0 refills | Status: DC

## 2022-04-10 MED ORDER — AMPHETAMINE-DEXTROAMPHETAMINE 10 MG PO TABS: 10.0000 mg | ORAL_TABLET | Freq: Every day | ORAL | 0 refills | Status: DC

## 2022-04-10 MED ORDER — LEVOTHYROXINE SODIUM 100 MCG PO TABS: 100.0000 ug | ORAL_TABLET | Freq: Every day | ORAL | 0 refills | Status: DC

## 2022-04-10 NOTE — Telephone Encounter (Signed)
Pt request 1 RF on Levothyroxin.  She is aware that she will need an annual and repet HCG per Dr Para March.

## 2022-05-07 ENCOUNTER — Other Ambulatory Visit: Payer: Self-pay | Admitting: Obstetrics and Gynecology

## 2022-05-07 DIAGNOSIS — E039 Hypothyroidism, unspecified: Secondary | ICD-10-CM

## 2022-05-10 ENCOUNTER — Encounter: Payer: Self-pay | Admitting: Family Medicine

## 2022-05-10 DIAGNOSIS — E039 Hypothyroidism, unspecified: Secondary | ICD-10-CM

## 2022-05-10 DIAGNOSIS — F900 Attention-deficit hyperactivity disorder, predominantly inattentive type: Secondary | ICD-10-CM

## 2022-05-10 NOTE — Telephone Encounter (Signed)
Are you willing to do this for the patient?  She has f/u appt with you 05/31/2022.  Tiajuana Amass, CMA

## 2022-05-13 MED ORDER — LEVOTHYROXINE SODIUM 100 MCG PO TABS: 100.0000 ug | ORAL_TABLET | Freq: Every day | ORAL | 1 refills | Status: DC

## 2022-05-31 ENCOUNTER — Ambulatory Visit (INDEPENDENT_AMBULATORY_CARE_PROVIDER_SITE_OTHER): Payer: 59 | Admitting: Family Medicine

## 2022-05-31 ENCOUNTER — Encounter: Payer: Self-pay | Admitting: Family Medicine

## 2022-05-31 VITALS — BP 105/74 | HR 83 | Ht 66.0 in | Wt 170.0 lb

## 2022-05-31 DIAGNOSIS — E039 Hypothyroidism, unspecified: Secondary | ICD-10-CM | POA: Diagnosis not present

## 2022-05-31 DIAGNOSIS — Z23 Encounter for immunization: Secondary | ICD-10-CM

## 2022-05-31 DIAGNOSIS — F419 Anxiety disorder, unspecified: Secondary | ICD-10-CM

## 2022-05-31 DIAGNOSIS — F900 Attention-deficit hyperactivity disorder, predominantly inattentive type: Secondary | ICD-10-CM | POA: Diagnosis not present

## 2022-05-31 NOTE — Progress Notes (Signed)
Taylor Yu - 36 y.o. female MRN [id number]  Date of birth: Nov 17, 1985  Subjective Chief Complaint  Patient presents with  . ADHD    HPI Taylor Yu is a 36 y.o. female here today for follow up visit.   History of hypothyroidism.  Feels pretty good on current strength of levothyroxine.  Denies side effects from medication.  Has no   Allergies  Allergen Reactions  . Meperidine Itching and Rash    Past Medical History:  Diagnosis Date  . ADHD   . Gestational diabetes   . History of COVID-19 03/28/2021  . Polyhydramnios affecting pregnancy   . Vaginal Pap smear, abnormal     Past Surgical History:  Procedure Laterality Date  . BREAST REDUCTION SURGERY    . CESAREAN SECTION    . COLPOSCOPY    . DILATION AND EVACUATION N/A 06/01/2021   Procedure: DILATATION AND EVACUATION;  Surgeon: Hermina Staggers, MD;  Location: [facility];  Service: Gynecology;  Laterality: N/A;  . LAPAROSCOPIC APPENDECTOMY N/A 01/16/2022   Procedure: APPENDECTOMY LAPAROSCOPIC;  Surgeon: Fritzi Mandes, MD;  Location: [facility];  Service: General;  Laterality: N/A;  . LEEP    . OPERATIVE ULTRASOUND N/A 06/01/2021   Procedure: OPERATIVE ULTRASOUND;  Surgeon: Hermina Staggers, MD;  Location: [facility];  Service: Gynecology;  Laterality: N/A;  . WISDOM TOOTH EXTRACTION      Social History   Socioeconomic History  . Marital status: Married    Spouse name: Not on file  . Number of children: Not on file  . Years of education: Not on file  . Highest education level: Not on file  Occupational History  . Not on file  Tobacco Use  . Smoking status: Never  . Smokeless tobacco: Never  Vaping Use  . Vaping Use: Not on file  Substance and Sexual Activity  . Alcohol use: Not Currently  . Drug use: Never  . Sexual activity: Yes    Birth control/protection: None  Other Topics Concern  . Not on file  Social History Narrative  . Not on file   Social Determinants of Health    Financial Resource Strain: Not on file  Food Insecurity: Not on file  Transportation Needs: Not on file  Physical Activity: Not on file  Stress: Not on file  Social Connections: Not on file    Family History  Problem Relation Age of Onset  . Multiple sclerosis Mother   . ADD / ADHD Sister   . Heart defect Brother   . Cancer Maternal Aunt   . Ovarian cancer Maternal Aunt   . Cancer Maternal Grandmother   . Alzheimer's disease Paternal Grandmother   . Stroke Paternal Grandmother     Health Maintenance  Topic Date Due  . INFLUENZA VACCINE  04/16/2022  . Hepatitis C Screening  11/28/2022 (Originally 11/19/2004)  . HIV Screening  11/28/2022 (Originally 11/19/2001)  . PAP SMEAR-Modifier  11/09/2022  . TETANUS/TDAP  01/22/2028  . COVID-19 Vaccine  Completed  . HPV VACCINES  Aged Out     ----------------------------------------------------------------------------------------------------------------------------------------------------------------------------------------------------------------- Physical Exam BP 105/74 (BP Location: Left Arm, Patient Position: Sitting, Cuff Size: Normal)   Pulse 83   Ht 5\' 6"  (1.676 m)   Wt 170 lb (77.1 kg)   SpO2 98%   BMI 27.44 kg/m   Physical Exam  ------------------------------------------------------------------------------------------------------------------------------------------------------------------------------------------------------------------- Assessment and Plan  No problem-specific Assessment & Plan notes found for this encounter.   No orders of the defined types were placed  in this encounter.   No follow-ups on file.    This visit occurred during the SARS-CoV-2 public health emergency.  Safety protocols were in place, including screening questions prior to the visit, additional usage of staff PPE, and extensive cleaning of exam room while observing appropriate contact time as indicated for disinfecting solutions.

## 2022-05-31 NOTE — Patient Instructions (Signed)
Great to see you today! We'll be in touch with lab results.  I have sent refills in for your medications.

## 2022-06-01 LAB — CBC WITH DIFFERENTIAL/PLATELET
Absolute Monocytes: 319 cells/uL (ref 200–950)
Basophils Absolute: 22 cells/uL (ref 0–200)
Basophils Relative: 0.4 %
Eosinophils Absolute: 38 cells/uL (ref 15–500)
Eosinophils Relative: 0.7 %
HCT: 40.3 % (ref 35.0–45.0)
Hemoglobin: 13.4 g/dL (ref 11.7–15.5)
Lymphs Abs: 1755 cells/uL (ref 850–3900)
MCH: 30.2 pg (ref 27.0–33.0)
MCHC: 33.3 g/dL (ref 32.0–36.0)
MCV: 91 fL (ref 80.0–100.0)
MPV: 9.2 fL (ref 7.5–12.5)
Monocytes Relative: 5.9 %
Neutro Abs: 3267 cells/uL (ref 1500–7800)
Neutrophils Relative %: 60.5 %
Platelets: 327 10*3/uL (ref 140–400)
RBC: 4.43 10*6/uL (ref 3.80–5.10)
RDW: 11.5 % (ref 11.0–15.0)
Total Lymphocyte: 32.5 %
WBC: 5.4 10*3/uL (ref 3.8–10.8)

## 2022-06-01 LAB — BASIC METABOLIC PANEL
BUN: 11 mg/dL (ref 7–25)
CO2: 30 mmol/L (ref 20–32)
Calcium: 9.8 mg/dL (ref 8.6–10.2)
Chloride: 101 mmol/L (ref 98–110)
Creat: 0.67 mg/dL (ref 0.50–0.97)
Glucose, Bld: 93 mg/dL (ref 65–99)
Potassium: 4.3 mmol/L (ref 3.5–5.3)
Sodium: 139 mmol/L (ref 135–146)

## 2022-06-01 LAB — TSH: TSH: 0.35 mIU/L — ABNORMAL LOW

## 2022-06-02 ENCOUNTER — Other Ambulatory Visit: Payer: Self-pay | Admitting: Family Medicine

## 2022-06-02 DIAGNOSIS — E039 Hypothyroidism, unspecified: Secondary | ICD-10-CM

## 2022-06-02 MED ORDER — LEVOTHYROXINE SODIUM 88 MCG PO TABS: 88.0000 ug | ORAL_TABLET | Freq: Every day | ORAL | 1 refills | Status: DC

## 2022-06-02 NOTE — Assessment & Plan Note (Signed)
Doing well on fluoxetine at current strength.  We will plan to continue.

## 2022-06-02 NOTE — Assessment & Plan Note (Signed)
Feels good with current dose of levothyroxine.  Updating TSH. 

## 2022-06-02 NOTE — Assessment & Plan Note (Signed)
We will with current medications.  We will plan to continue.

## 2022-06-03 MED ORDER — AMPHETAMINE-DEXTROAMPHETAMINE 10 MG PO TABS: 10.0000 mg | ORAL_TABLET | Freq: Every day | ORAL | 0 refills | Status: DC

## 2022-06-03 MED ORDER — AMPHETAMINE-DEXTROAMPHET ER 30 MG PO CP24: 30.0000 mg | ORAL_CAPSULE | Freq: Every day | ORAL | 0 refills | Status: DC

## 2022-06-03 MED ORDER — LEVOTHYROXINE SODIUM 88 MCG PO TABS: 88.0000 ug | ORAL_TABLET | Freq: Every day | ORAL | 1 refills | Status: DC

## 2022-06-03 MED ORDER — FLUOXETINE HCL 20 MG PO CAPS: 20.0000 mg | ORAL_CAPSULE | Freq: Every day | ORAL | 1 refills | Status: DC

## 2022-06-03 NOTE — Addendum Note (Signed)
Addended by: Narda Rutherford on: 06/03/2022 09:09 AM   Modules accepted: Orders

## 2022-06-03 NOTE — Addendum Note (Signed)
Addended by: Perlie Mayo on: 06/03/2022 10:05 AM   Modules accepted: Orders

## 2022-06-10 NOTE — Progress Notes (Unsigned)
GYNECOLOGY OFFICE VISIT NOTE  History:   Taylor Yu is a 36 y.o. 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 She has been TTC for 6 months without success.  When she was last pregnant she was diagnosed with a molar pregnancy. Her follow up testing was normal and she was cleared to TTC.   Cycles are 26-28 days. She has + OPKs. They are TTC a couple times in the fertile window. When her cycle comes it is very heavy on day 1 and moderate on day 2 and tapers off quickly. She doesn't want multiple babies, she only wants one more and so hopes to avoid fertility treatment.   Her thyroid is well controlled on Synthroid if not sometimes overly suppressed.    Since I last saw her, she had an appendectomy as well. Chart is updated.      Past Medical History:  Diagnosis Date   ADHD    Gestational diabetes    History of COVID-19 03/28/2021   Polyhydramnios affecting pregnancy    Vaginal Pap smear, abnormal     Past Surgical History:  Procedure Laterality Date   BREAST REDUCTION SURGERY     CESAREAN SECTION     COLPOSCOPY     DILATION AND EVACUATION N/A 06/01/2021   Procedure: DILATATION AND EVACUATION;  Surgeon: Chancy Milroy, MD;  Location: [place];  Service: Gynecology;  Laterality: N/A;   LAPAROSCOPIC APPENDECTOMY N/A 01/16/2022   Procedure: APPENDECTOMY LAPAROSCOPIC;  Surgeon: Dwan Bolt, MD;  Location: [facility];  Service: General;  Laterality: N/A;   LEEP     OPERATIVE ULTRASOUND N/A 06/01/2021   Procedure: OPERATIVE ULTRASOUND;  Surgeon: Chancy Milroy, MD;  Location: [place];  Service: Gynecology;  Laterality: N/A;   WISDOM TOOTH EXTRACTION      The following portions of the patient's history were reviewed and updated as appropriate: allergies, current medications, past family history, past medical history, past social history, past surgical history and problem list.   Health Maintenance:   Normal pap and negative HRHPV on  07/2020  Review of Systems:  Pertinent items noted in HPI and remainder of comprehensive ROS otherwise negative.  Physical Exam:  BP 109/77   Pulse (!) 118   Ht 5\' 6"  (1.676 m)   Wt 170 lb (77.1 kg)   LMP 05/21/2022   BMI 27.44 kg/m  CONSTITUTIONAL: Well-developed, well-nourished female in no acute distress.  HEENT:  Normocephalic, atraumatic. External right and left ear normal. No scleral icterus.  NECK: Normal range of motion, supple, no masses noted on observation SKIN: No rash noted. Not diaphoretic. No erythema. No pallor. MUSCULOSKELETAL: Normal range of motion. No edema noted. NEUROLOGIC: Alert and oriented to person, place, and time. Normal muscle tone coordination. No cranial nerve deficit noted. PSYCHIATRIC: Normal mood and affect. Normal behavior. Normal judgment and thought content.  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 Information given. Reviewed evidence on selenium and Vit E.  - Tubal Patency: If all other testing is normal, we can check tubal patency via HSG through [facility]. Reviewed what the procedure entails. Discussed the diagnostic and therapeutic nature of HSG.  - Reviewed factors that most behaviors do and don't contribute to infertility - Encouraged continued TTC during this time. Reviewed conception and timing of ovulation and timing of "  trying" - Referral placed to [facility] that she can make an appt for sometime in the future/  -     DG Hysterogram (HSG); Future -     Cancel: Anti mullerian hormone -     B-HCG Quant -     Ambulatory referral to Endocrinology -     Anti-Mullerian Hormone [facility]), Female  2. Molar pregnancy HCG monitoring completed but will check one more since doing blood work today  -     B-HCG Quant    Routine preventative  health maintenance measures emphasized. Please refer to After Visit Summary for other counseling recommendations.   No follow-ups on file.  Radene Gunning, MD, [place] for [facility], [place]

## 2022-06-13 ENCOUNTER — Encounter: Payer: Self-pay | Admitting: Obstetrics and Gynecology

## 2022-06-13 ENCOUNTER — Ambulatory Visit (INDEPENDENT_AMBULATORY_CARE_PROVIDER_SITE_OTHER): Payer: 59 | Admitting: Obstetrics and Gynecology

## 2022-06-13 VITALS — BP 109/77 | HR 118 | Ht 66.0 in | Wt 170.0 lb

## 2022-06-13 DIAGNOSIS — N979 Female infertility, unspecified: Secondary | ICD-10-CM

## 2022-06-13 DIAGNOSIS — O020 Blighted ovum and nonhydatidiform mole: Secondary | ICD-10-CM

## 2022-06-13 NOTE — Patient Instructions (Signed)
HSG - CPT codes: [phone], [phone]

## 2022-06-17 LAB — ANTI-MULLERIAN HORMONE (AMH), FEMALE: Anti-Mullerian Hormones(AMH), Female: 3.08 ng/mL (ref 0.36–10.07)

## 2022-06-17 LAB — HCG, QUANTITATIVE, PREGNANCY: HCG, Total, QN: <5 m[IU]/mL

## 2022-06-19 ENCOUNTER — Encounter: Payer: Self-pay | Admitting: Obstetrics and Gynecology

## 2022-07-29 ENCOUNTER — Other Ambulatory Visit: Payer: 59

## 2022-08-07 ENCOUNTER — Other Ambulatory Visit: Payer: Self-pay | Admitting: Family Medicine

## 2022-08-07 DIAGNOSIS — F900 Attention-deficit hyperactivity disorder, predominantly inattentive type: Secondary | ICD-10-CM

## 2022-08-07 MED ORDER — AMPHETAMINE-DEXTROAMPHETAMINE 10 MG PO TABS: 10.0000 mg | ORAL_TABLET | Freq: Every day | ORAL | 0 refills | Status: DC

## 2022-08-07 MED ORDER — AMPHETAMINE-DEXTROAMPHET ER 30 MG PO CP24: 30.0000 mg | ORAL_CAPSULE | Freq: Every day | ORAL | 0 refills | Status: DC

## 2022-08-26 ENCOUNTER — Encounter: Payer: Self-pay | Admitting: *Deleted

## 2022-08-26 ENCOUNTER — Encounter: Payer: Self-pay | Admitting: Obstetrics and Gynecology

## 2022-12-02 ENCOUNTER — Encounter: Payer: Self-pay | Admitting: Family Medicine

## 2022-12-02 ENCOUNTER — Ambulatory Visit (INDEPENDENT_AMBULATORY_CARE_PROVIDER_SITE_OTHER): Payer: 59 | Admitting: Family Medicine

## 2022-12-02 VITALS — BP 109/72 | HR 103 | Ht 66.0 in | Wt 179.0 lb

## 2022-12-02 DIAGNOSIS — Z1159 Encounter for screening for other viral diseases: Secondary | ICD-10-CM

## 2022-12-02 DIAGNOSIS — E039 Hypothyroidism, unspecified: Secondary | ICD-10-CM | POA: Diagnosis not present

## 2022-12-02 DIAGNOSIS — Z0000 Encounter for general adult medical examination without abnormal findings: Secondary | ICD-10-CM | POA: Diagnosis not present

## 2022-12-02 DIAGNOSIS — Z114 Encounter for screening for human immunodeficiency virus [HIV]: Secondary | ICD-10-CM

## 2022-12-02 DIAGNOSIS — F900 Attention-deficit hyperactivity disorder, predominantly inattentive type: Secondary | ICD-10-CM

## 2022-12-02 DIAGNOSIS — Z13220 Encounter for screening for lipoid disorders: Secondary | ICD-10-CM

## 2022-12-02 MED ORDER — AMPHETAMINE-DEXTROAMPHETAMINE 15 MG PO TABS: ORAL_TABLET | ORAL | 0 refills | Status: DC

## 2022-12-02 NOTE — Assessment & Plan Note (Signed)
Update TSH

## 2022-12-02 NOTE — Progress Notes (Signed)
Taylor Yu - 37 y.o. female MRN [id number]  Date of birth: 04/16/1986  Subjective Chief Complaint  Patient presents with   Annual Exam    HPI Taylor Yu is a 37 y.o. female here today for annual exam.   She reports that she is doing pretty well.  She has been seeing fertility specialist as well.  Feels that adderall at current strength doesn't feel effective in the afternoon.   She stays moderately active and feels that diet is pretty good.  She is a non-smoker.  Denies excess EtOH use.    She is up to date on PAP.  Had this in 2021 at [facility]-Normal cytology, negative HPV.    Review of Systems  Constitutional:  Negative for chills, fever, malaise/fatigue and weight loss.  HENT:  Negative for congestion, ear pain and sore throat.   Eyes:  Negative for blurred vision, double vision and pain.  Respiratory:  Negative for cough and shortness of breath.   Cardiovascular:  Negative for chest pain and palpitations.  Gastrointestinal:  Negative for abdominal pain, blood in stool, constipation, heartburn and nausea.  Genitourinary:  Negative for dysuria and urgency.  Musculoskeletal:  Negative for joint pain and myalgias.  Neurological:  Negative for dizziness and headaches.  Endo/Heme/Allergies:  Does not bruise/bleed easily.  Psychiatric/Behavioral:  Negative for depression. The patient is not nervous/anxious and does not have insomnia.     Allergies  Allergen Reactions   Meperidine Itching and Rash    Past Medical History:  Diagnosis Date   ADHD    Gestational diabetes    History of COVID-19 03/28/2021   Polyhydramnios affecting pregnancy    Vaginal Pap smear, abnormal     Past Surgical History:  Procedure Laterality Date   BREAST REDUCTION SURGERY     CESAREAN SECTION     COLPOSCOPY     DILATION AND EVACUATION N/A 06/01/2021   Procedure: DILATATION AND EVACUATION;  Surgeon: Chancy Milroy, MD;  Location: [place];  Service: Gynecology;   Laterality: N/A;   LAPAROSCOPIC APPENDECTOMY N/A 01/16/2022   Procedure: APPENDECTOMY LAPAROSCOPIC;  Surgeon: Dwan Bolt, MD;  Location[facility];  Service: General;  Laterality: N/A;   LEEP     OPERATIVE ULTRASOUND N/A 06/01/2021   Procedure: OPERATIVE ULTRASOUND;  Surgeon: Chancy Milroy, MD;  Location: [place];  Service: Gynecology;  Laterality: N/A;   WISDOM TOOTH EXTRACTION      Social History   Socioeconomic History   Marital status: Married    Spouse name: Not on file   Number of children: Not on file   Years of education: Not on file   Highest education level: Not on file  Occupational History   Not on file  Tobacco Use   Smoking status: Never   Smokeless tobacco: Never  Vaping Use   Vaping Use: Not on file  Substance and Sexual Activity   Alcohol use: Not Currently   Drug use: Never   Sexual activity: Yes    Birth control/protection: None  Other Topics Concern   Not on file  Social History Narrative   Not on file   Social Determinants of Health   Financial Resource Strain: Not on file  Food Insecurity: Not on file  Transportation Needs: Not on file  Physical Activity: Not on file  Stress: Not on file  Social Connections: Not on file    Family History  Problem Relation Age of Onset   Multiple sclerosis Mother  ADD / ADHD Sister    Heart defect Brother    Cancer Maternal Aunt    Ovarian cancer Maternal Aunt    Cancer Maternal Grandmother    Alzheimer's disease Paternal Grandmother    Stroke Paternal Grandmother     Health Maintenance  Topic Date Due   Hepatitis C Screening  Never done   COVID-19 Vaccine (2 - 2023-24 season) 05/17/2022   PAP SMEAR-Modifier  11/09/2022   DTaP/Tdap/Td (3 - Td or Tdap) 01/22/2028   INFLUENZA VACCINE  Completed   HIV Screening  Completed   HPV VACCINES  Aged Out      ----------------------------------------------------------------------------------------------------------------------------------------------------------------------------------------------------------------- Physical Exam BP 109/72 (BP Location: Left Arm, Patient Position: Sitting, Cuff Size: Normal)   Pulse (!) 103   Ht 5\' 6"  (1.676 m)   Wt 179 lb (81.2 kg)   SpO2 100%   BMI 28.89 kg/m   Physical Exam Constitutional:      General: She is not in acute distress. HENT:     Head: Normocephalic and atraumatic.     Right Ear: Tympanic membrane and ear canal normal.     Left Ear: Tympanic membrane and ear canal normal.     Nose: Nose normal.  Eyes:     General: No scleral icterus.    Conjunctiva/sclera: Conjunctivae normal.  Neck:     Thyroid: No thyromegaly.  Cardiovascular:     Rate and Rhythm: Normal rate and regular rhythm.     Heart sounds: Normal heart sounds.  Pulmonary:     Effort: Pulmonary effort is normal.     Breath sounds: Normal breath sounds.  Abdominal:     General: Bowel sounds are normal. There is no distension.     Palpations: Abdomen is soft.     Tenderness: There is no abdominal tenderness. There is no guarding.  Musculoskeletal:        General: Normal range of motion.     Cervical back: Normal range of motion and neck supple.  Lymphadenopathy:     Cervical: No cervical adenopathy.  Skin:    General: Skin is warm and dry.     Findings: No rash.  Neurological:     General: No focal deficit present.     Mental Status: She is alert and oriented to person, place, and time.     Cranial Nerves: No cranial nerve deficit.     Coordination: Coordination normal.  Psychiatric:        Mood and Affect: Mood normal.        Behavior: Behavior normal.      ------------------------------------------------------------------------------------------------------------------------------------------------------------------------------------------------------------------- Assessment and Plan  Hypothyroidism Update TSH  Well adult exam Well adult Orders Placed This Encounter  Procedures   COMPLETE METABOLIC PANEL WITH GFR   CBC with Differential   Lipid Panel w/reflex Direct LDL   TSH   HIV antibody (with reflex)   Hepatitis C Antibody  Screenings: per lab orders Immunizations: UTD Anticipatory guidance/Risk factor reduction:  Recommendations per AVS.     Meds ordered this encounter  Medications   amphetamine-dextroamphetamine (ADDERALL) 15 MG tablet    Sig: In the afternoon as needed.    Dispense:  90 tablet    Refill:  0    Return in about 6 months (around 06/04/2023) for ADD.    This visit occurred during the SARS-CoV-2 public health emergency.  Safety protocols were in place, including screening questions prior to the visit, additional usage of staff PPE, and extensive cleaning of exam room while observing appropriate contact  time as indicated for disinfecting solutions.

## 2022-12-02 NOTE — Assessment & Plan Note (Signed)
Well adult Orders Placed This Encounter  Procedures   COMPLETE METABOLIC PANEL WITH GFR   CBC with Differential   Lipid Panel w/reflex Direct LDL   TSH   HIV antibody (with reflex)   Hepatitis C Antibody  Screenings: per lab orders Immunizations: UTD Anticipatory guidance/Risk factor reduction:  Recommendations per AVS.

## 2022-12-02 NOTE — Patient Instructions (Signed)
Try the increase of adderall in the afternoon to 15mg .  Let me know if this is not effective.  We can look at alternatives.   See me again in 6 months.    Preventive Care 22-36 Years Old, Female Preventive care refers to lifestyle choices and visits with your health care provider that can promote health and wellness. Preventive care visits are also called wellness exams. What can I expect for my preventive care visit? Counseling During your preventive care visit, your health care provider may ask about your: Medical history, including: Past medical problems. Family medical history. Pregnancy history. Current health, including: Menstrual cycle. Method of birth control. Emotional well-being. Home life and relationship well-being. Sexual activity and sexual health. Lifestyle, including: Alcohol, nicotine or tobacco, and drug use. Access to firearms. Diet, exercise, and sleep habits. Work and work Statistician. Sunscreen use. Safety issues such as seatbelt and bike helmet use. Physical exam Your health care provider may check your: Height and weight. These may be used to calculate your BMI (body mass index). BMI is a measurement that tells if you are at a healthy weight. Waist circumference. This measures the distance around your waistline. This measurement also tells if you are at a healthy weight and may help predict your risk of certain diseases, such as type 2 diabetes and high blood pressure. Heart rate and blood pressure. Body temperature. Skin for abnormal spots. What immunizations do I need?  Vaccines are usually given at various ages, according to a schedule. Your health care provider will recommend vaccines for you based on your age, medical history, and lifestyle or other factors, such as travel or where you work. What tests do I need? Screening Your health care provider may recommend screening tests for certain conditions. This may include: Pelvic exam and Pap  test. Lipid and cholesterol levels. Diabetes screening. This is done by checking your blood sugar (glucose) after you have not eaten for a while (fasting). Hepatitis B test. Hepatitis C test. HIV (human immunodeficiency virus) test. STI (sexually transmitted infection) testing, if you are at risk. BRCA-related cancer screening. This may be done if you have a family history of breast, ovarian, tubal, or peritoneal cancers. Talk with your health care provider about your test results, treatment options, and if necessary, the need for more tests. Follow these instructions at home: Eating and drinking  Eat a healthy diet that includes fresh fruits and vegetables, whole grains, lean protein, and low-fat dairy products. Take vitamin and mineral supplements as recommended by your health care provider. Do not drink alcohol if: Your health care provider tells you not to drink. You are pregnant, may be pregnant, or are planning to become pregnant. If you drink alcohol: Limit how much you have to 0-1 drink a day. Know how much alcohol is in your drink. In the [place] one drink equals one 12 oz bottle of beer (355 mL), one 5 oz glass of wine (148 mL), or one 1 oz glass of hard liquor (44 mL). Lifestyle Brush your teeth every morning and night with fluoride toothpaste. Floss one time each day. Exercise for at least 30 minutes 5 or more days each week. Do not use any products that contain nicotine or tobacco. These products include cigarettes, chewing tobacco, and vaping devices, such as e-cigarettes. If you need help quitting, ask your health care provider. Do not use drugs. If you are sexually active, practice safe sex. Use a condom or other form of protection to prevent STIs. If you  do not wish to become pregnant, use a form of birth control. If you plan to become pregnant, see your health care provider for a prepregnancy visit. Find healthy ways to manage stress, such as: Meditation, yoga, or  listening to music. Journaling. Talking to a trusted person. Spending time with friends and family. Minimize exposure to UV radiation to reduce your risk of skin cancer. Safety Always wear your seat belt while driving or riding in a vehicle. Do not drive: If you have been drinking alcohol. Do not ride with someone who has been drinking. If you have been using any mind-altering substances or drugs. While texting. When you are tired or distracted. Wear a helmet and other protective equipment during sports activities. If you have firearms in your house, make sure you follow all gun safety procedures. Seek help if you have been physically or sexually abused. What's next? Go to your health care provider once a year for an annual wellness visit. Ask your health care provider how often you should have your eyes and teeth checked. Stay up to date on all vaccines. This information is not intended to replace advice given to you by your health care provider. Make sure you discuss any questions you have with your health care provider. Document Revised: 02/28/2021 Document Reviewed: 02/28/2021 Elsevier Patient Education  [place].

## 2022-12-03 LAB — COMPLETE METABOLIC PANEL WITH GFR
AG Ratio: 1.5 (calc) (ref 1.0–2.5)
ALT: 22 U/L (ref 6–29)
AST: 23 U/L (ref 10–30)
Albumin: 4.2 g/dL (ref 3.6–5.1)
Alkaline phosphatase (APISO): 65 U/L (ref 31–125)
BUN: 19 mg/dL (ref 7–25)
CO2: 29 mmol/L (ref 20–32)
Calcium: 9.2 mg/dL (ref 8.6–10.2)
Chloride: 105 mmol/L (ref 98–110)
Creat: 0.68 mg/dL (ref 0.50–0.97)
Globulin: 2.8 g/dL (calc) (ref 1.9–3.7)
Glucose, Bld: 72 mg/dL (ref 65–99)
Potassium: 4.7 mmol/L (ref 3.5–5.3)
Sodium: 141 mmol/L (ref 135–146)
Total Bilirubin: 0.4 mg/dL (ref 0.2–1.2)
Total Protein: 7 g/dL (ref 6.1–8.1)
eGFR: 116 mL/min/{1.73_m2} (ref >=60)

## 2022-12-03 LAB — CBC WITH DIFFERENTIAL/PLATELET
Absolute Monocytes: 551 cells/uL (ref 200–950)
Basophils Absolute: 41 cells/uL (ref 0–200)
Basophils Relative: 0.7 %
Eosinophils Absolute: 151 cells/uL (ref 15–500)
Eosinophils Relative: 2.6 %
HCT: 37.7 % (ref 35.0–45.0)
Hemoglobin: 12.7 g/dL (ref 11.7–15.5)
Lymphs Abs: 1398 cells/uL (ref 850–3900)
MCH: 30.8 pg (ref 27.0–33.0)
MCHC: 33.7 g/dL (ref 32.0–36.0)
MCV: 91.3 fL (ref 80.0–100.0)
MPV: 9.9 fL (ref 7.5–12.5)
Monocytes Relative: 9.5 %
Neutro Abs: 3660 cells/uL (ref 1500–7800)
Neutrophils Relative %: 63.1 %
Platelets: 283 10*3/uL (ref 140–400)
RBC: 4.13 10*6/uL (ref 3.80–5.10)
RDW: 11.7 % (ref 11.0–15.0)
Total Lymphocyte: 24.1 %
WBC: 5.8 10*3/uL (ref 3.8–10.8)

## 2022-12-03 LAB — TSH: TSH: 2.68 mIU/L

## 2022-12-03 LAB — LIPID PANEL W/REFLEX DIRECT LDL
Cholesterol: 207 mg/dL — ABNORMAL HIGH (ref <200)
HDL: 61 mg/dL (ref >=50)
LDL Cholesterol (Calc): 120 mg/dL (calc) — ABNORMAL HIGH
Non-HDL Cholesterol (Calc): 146 mg/dL (calc) — ABNORMAL HIGH (ref <130)
Total CHOL/HDL Ratio: 3.4 (calc) (ref <5.0)
Triglycerides: 138 mg/dL (ref <150)

## 2022-12-03 LAB — HEPATITIS C ANTIBODY: Hepatitis C Ab: NONREACTIVE

## 2022-12-03 LAB — HIV ANTIBODY (ROUTINE TESTING W REFLEX): HIV 1&2 Ab, 4th Generation: NONREACTIVE

## 2022-12-06 ENCOUNTER — Other Ambulatory Visit: Payer: Self-pay | Admitting: Family Medicine

## 2022-12-06 DIAGNOSIS — F900 Attention-deficit hyperactivity disorder, predominantly inattentive type: Secondary | ICD-10-CM

## 2022-12-09 MED ORDER — AMPHETAMINE-DEXTROAMPHET ER 30 MG PO CP24: 30.0000 mg | ORAL_CAPSULE | Freq: Every day | ORAL | 0 refills | Status: DC

## 2023-03-06 ENCOUNTER — Other Ambulatory Visit: Payer: Self-pay | Admitting: Family Medicine

## 2023-03-10 IMAGING — US US OB < 14 WEEKS - US OB TV
1 series · 14 of 28 positions shown · non-contrast
Comparison: None.

CLINICAL DATA: Fetal viability.

EXAM:
OBSTETRIC <14 WK US AND TRANSVAGINAL OB US
TECHNIQUE: Both transabdominal and transvaginal ultrasound examinations were
performed for complete evaluation of the gestation as well as the
maternal uterus, adnexal regions, and pelvic cul-de-sac.
Transvaginal technique was performed to assess early pregnancy.

[Series 1: us ob less than 14 weeks with ob transvaginal · 112 acquisitions, 14 frames shown]
[im 5/112]
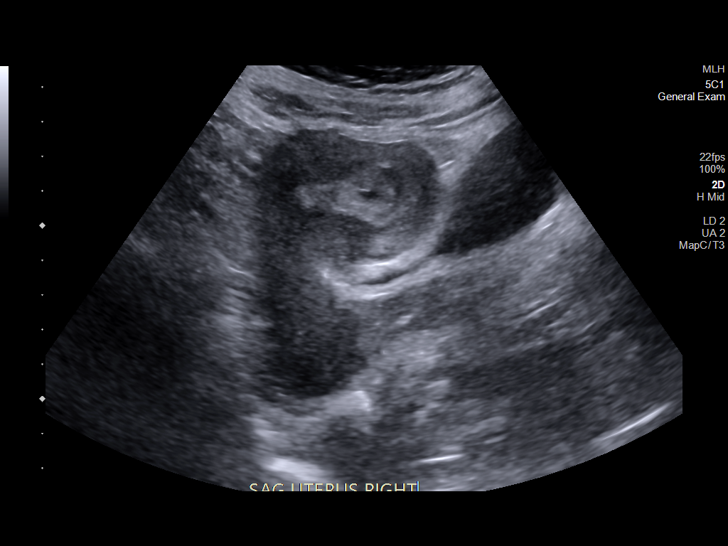
[im 13/112]
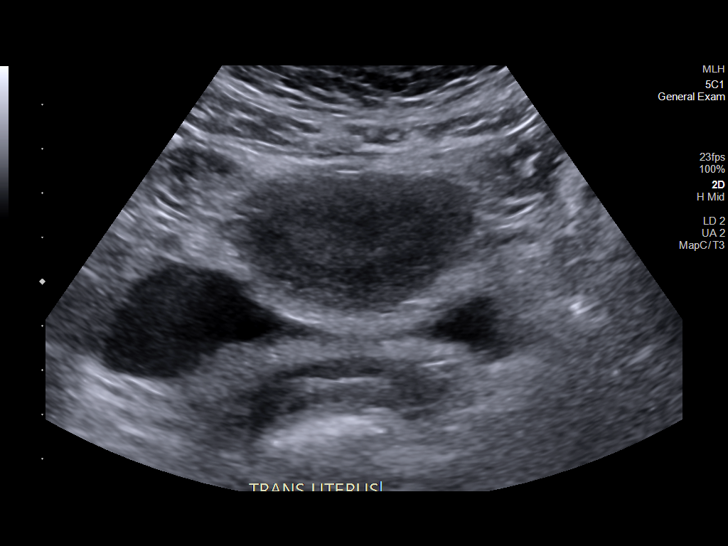
[im 21/112]
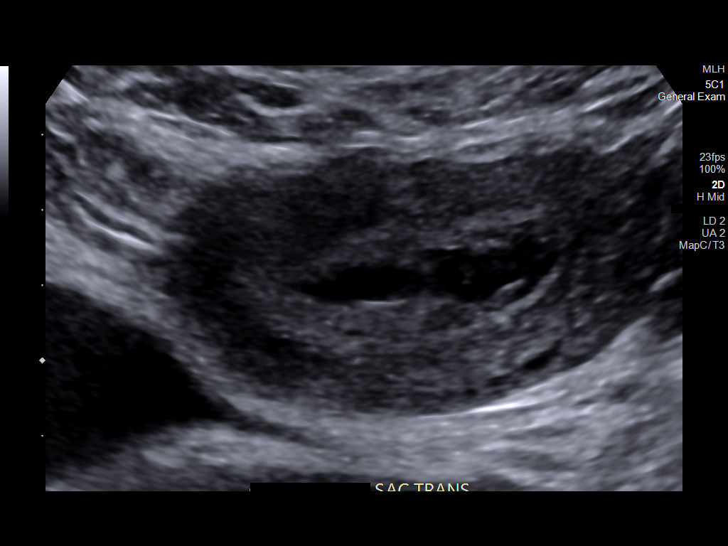
[im 29/112]
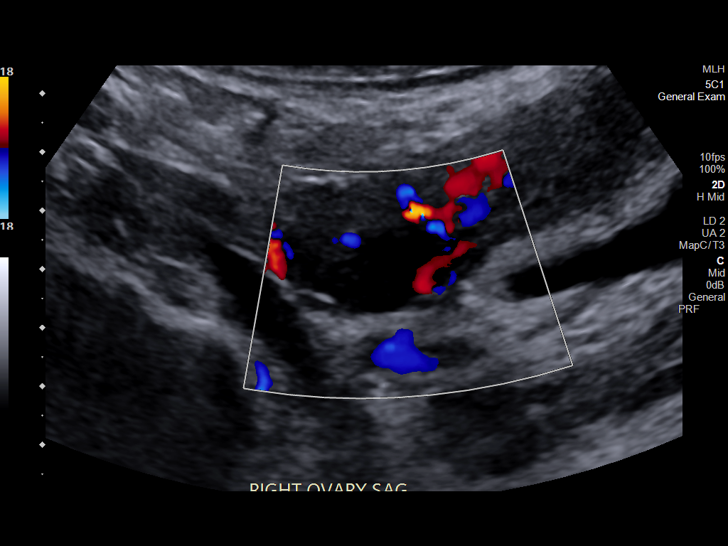
[im 38/112]
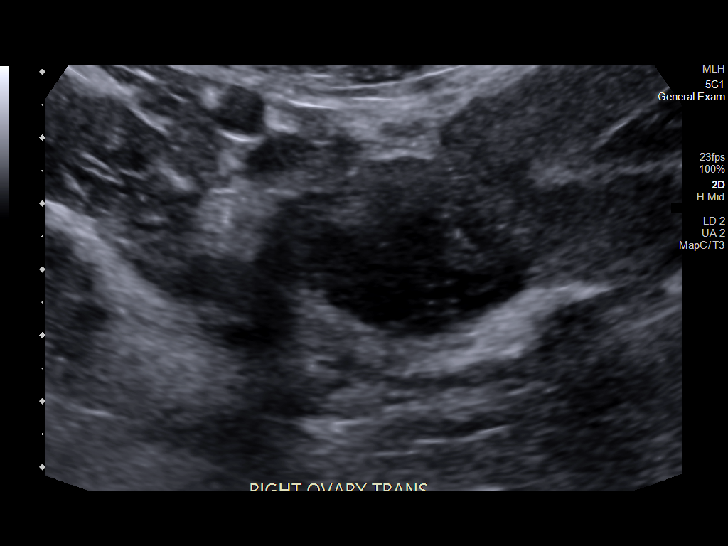
[im 46/112]
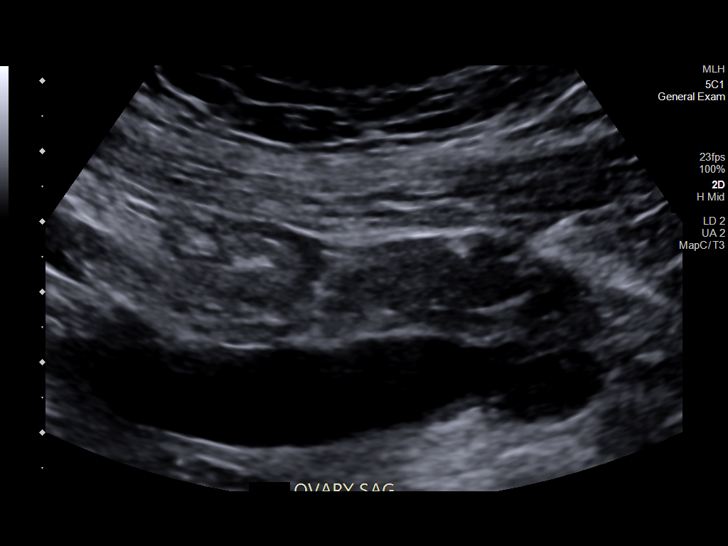
[im 54/112]
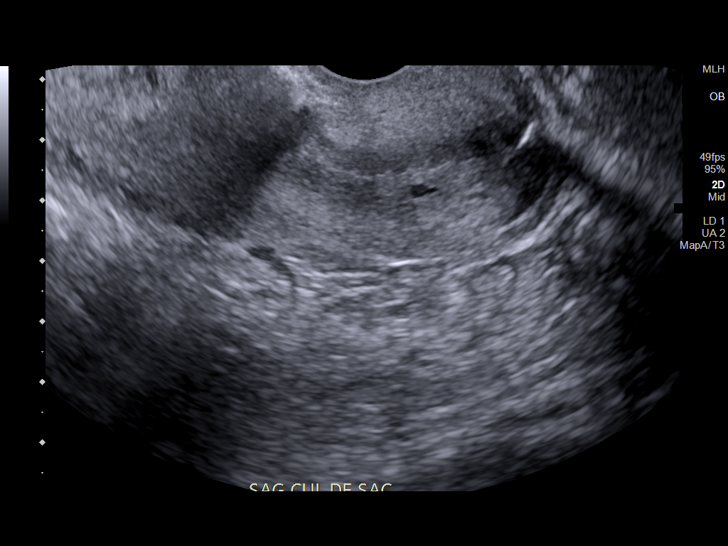
[im 62/112]
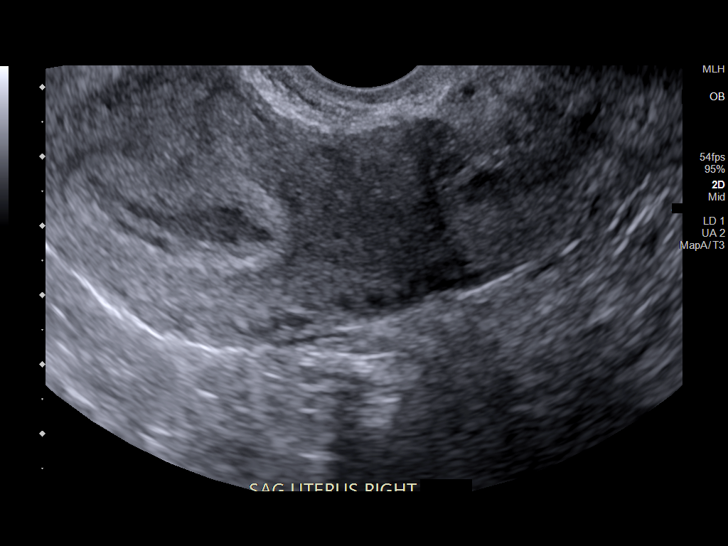
[im 70/112]
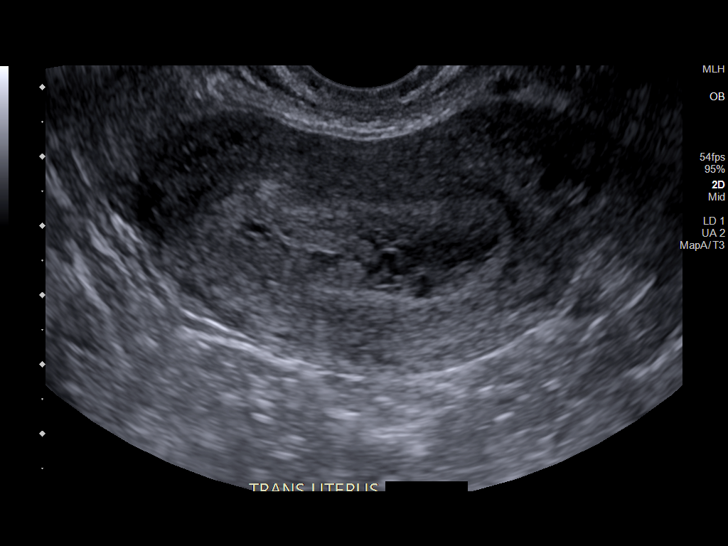
[im 79/112]
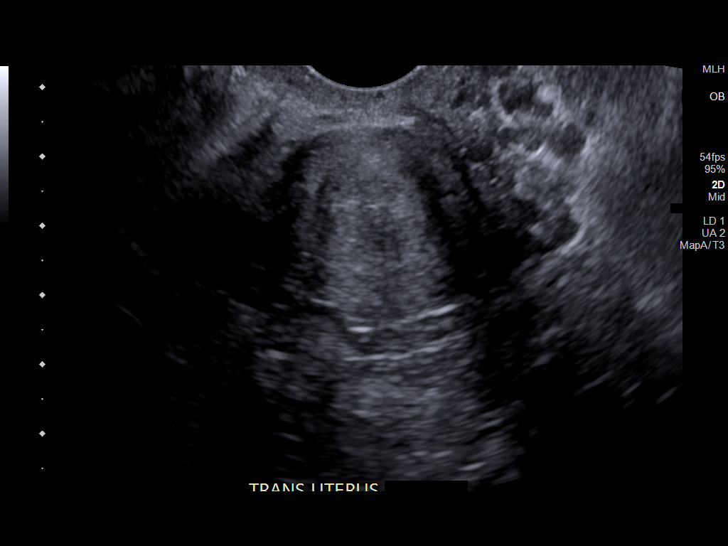
[im 87/112]
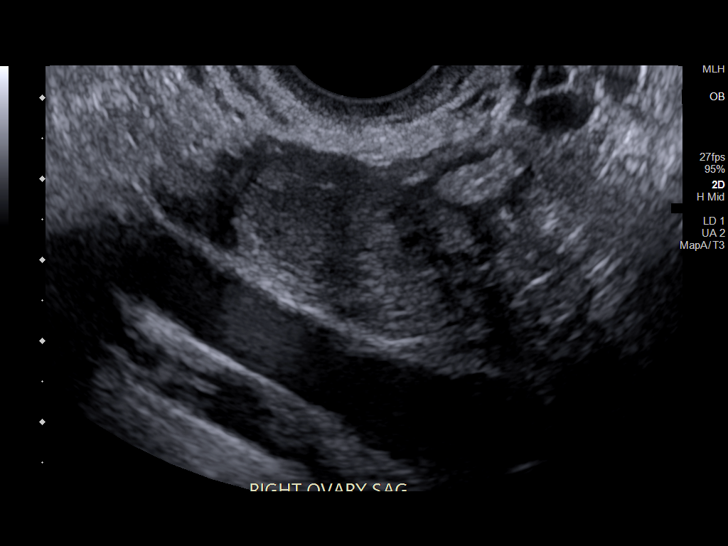
[im 95/112]
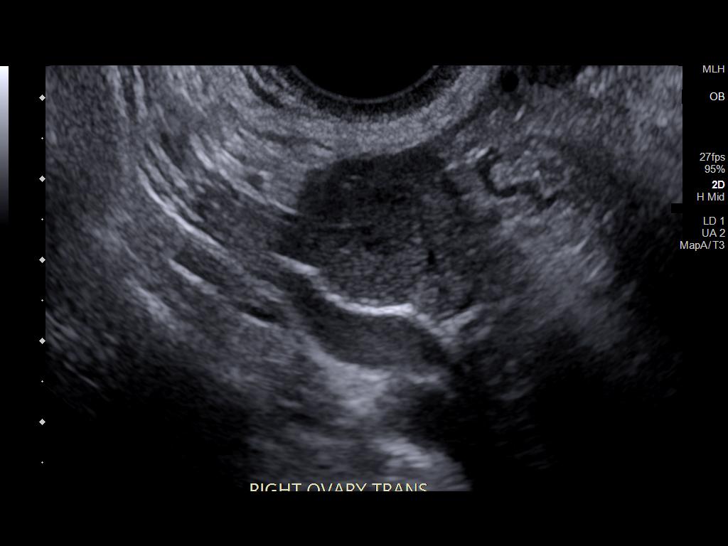
[im 103/112]
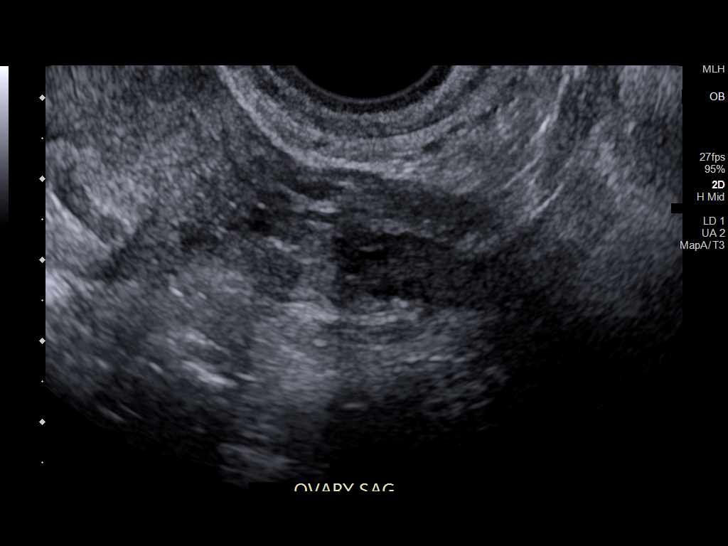
[im 112/112]
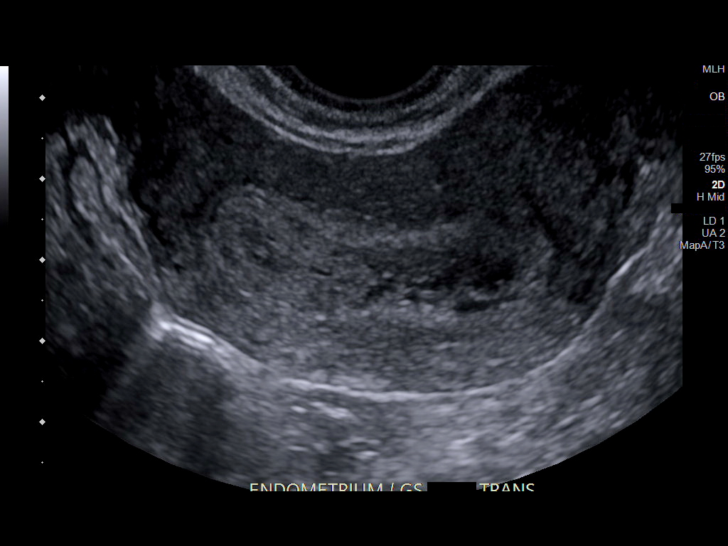

[14 of 28 positions shown; findings below may reference images not displayed]

FINDINGS: Intrauterine gestational sac: None

Yolk sac:  Not Visualized.

Embryo:  Not Visualized.

Cardiac Activity: Not Visualized.

Subchorionic hemorrhage:  None visualized.

Maternal uterus/adnexae: Probable corpus luteum cyst seen in right
ovary. Left ovary is unremarkable. Complex echogenic material is
noted within the endometrial space which may represent decidual
reaction no free fluid is noted.
IMPRESSION: No intrauterine gestational sac, yolk sac, fetal pole, or cardiac
activity visualized. Differential considerations include
intrauterine gestation too early to be sonographically visualized,
spontaneous abortion, or ectopic pregnancy. Consider follow-up
ultrasound in 14 days and serial quantitative beta HCG follow-up.

## 2023-03-12 ENCOUNTER — Other Ambulatory Visit: Payer: Self-pay | Admitting: Family Medicine

## 2023-03-12 DIAGNOSIS — F900 Attention-deficit hyperactivity disorder, predominantly inattentive type: Secondary | ICD-10-CM

## 2023-03-12 MED ORDER — AMPHETAMINE-DEXTROAMPHET ER 30 MG PO CP24: 30.0000 mg | ORAL_CAPSULE | Freq: Every day | ORAL | 0 refills | Status: DC

## 2023-03-12 MED ORDER — AMPHETAMINE-DEXTROAMPHETAMINE 15 MG PO TABS: ORAL_TABLET | ORAL | 0 refills | Status: DC

## 2023-04-13 ENCOUNTER — Other Ambulatory Visit: Payer: Self-pay | Admitting: Family Medicine

## 2023-04-18 ENCOUNTER — Other Ambulatory Visit: Payer: Self-pay | Admitting: Family Medicine

## 2023-04-18 DIAGNOSIS — F900 Attention-deficit hyperactivity disorder, predominantly inattentive type: Secondary | ICD-10-CM

## 2023-04-21 MED ORDER — AMPHETAMINE-DEXTROAMPHETAMINE 15 MG PO TABS: ORAL_TABLET | ORAL | 0 refills | Status: AC

## 2023-04-21 MED ORDER — AMPHETAMINE-DEXTROAMPHET ER 30 MG PO CP24: 30.0000 mg | ORAL_CAPSULE | Freq: Every day | ORAL | 0 refills | Status: AC

## 2023-06-05 ENCOUNTER — Ambulatory Visit: Payer: 59 | Admitting: Family Medicine

## 2023-06-16 ENCOUNTER — Ambulatory Visit (INDEPENDENT_AMBULATORY_CARE_PROVIDER_SITE_OTHER): Payer: 59 | Admitting: Family Medicine

## 2023-06-16 ENCOUNTER — Encounter: Payer: Self-pay | Admitting: Family Medicine

## 2023-06-16 VITALS — BP 102/66 | HR 87 | Ht 66.0 in | Wt 179.0 lb

## 2023-06-16 DIAGNOSIS — E039 Hypothyroidism, unspecified: Secondary | ICD-10-CM

## 2023-06-16 DIAGNOSIS — F900 Attention-deficit hyperactivity disorder, predominantly inattentive type: Secondary | ICD-10-CM | POA: Diagnosis not present

## 2023-06-16 DIAGNOSIS — F419 Anxiety disorder, unspecified: Secondary | ICD-10-CM | POA: Diagnosis not present

## 2023-06-16 DIAGNOSIS — Z23 Encounter for immunization: Secondary | ICD-10-CM | POA: Diagnosis not present

## 2023-06-16 NOTE — Assessment & Plan Note (Signed)
We will with current medications.  We will plan to continue.

## 2023-06-16 NOTE — Assessment & Plan Note (Signed)
Feels good with current strength of levothyroxine.

## 2023-06-16 NOTE — Progress Notes (Signed)
Taylor Yu - 37 y.o. female MRN [id number]  Date of birth: 27-Oct-1985  Subjective Chief Complaint  Patient presents with   ADD    HPI Taylor Yu is a 37 y.o. female here today for follow up visit.   She remains on fluoxetine 20mg  daily as well as levothyroxine daily .  She feels that these continue to work well for her.  Adderall at current strength and dosing remains effective.  She has not had noticeable side effects with this at current strength.  She is undergoing fertility treatments at this time as well and is on multiple medications for this.    ROS:  A comprehensive ROS was completed and negative except as noted per HPI  Allergies  Allergen Reactions   Meperidine Itching and Rash    Past Medical History:  Diagnosis Date   ADHD    Gestational diabetes    History of COVID-19 03/28/2021   Polyhydramnios affecting pregnancy    Vaginal Pap smear, abnormal     Past Surgical History:  Procedure Laterality Date   BREAST REDUCTION SURGERY     CESAREAN SECTION     COLPOSCOPY     DILATION AND EVACUATION N/A 06/01/2021   Procedure: DILATATION AND EVACUATION;  Surgeon: Hermina Staggers, MD;  Location: [facility];  Service: Gynecology;  Laterality: N/A;   LAPAROSCOPIC APPENDECTOMY N/A 01/16/2022   Procedure: APPENDECTOMY LAPAROSCOPIC;  Surgeon: Fritzi Mandes, MD;  Location: [facility];  Service: General;  Laterality: N/A;   LEEP     OPERATIVE ULTRASOUND N/A 06/01/2021   Procedure: OPERATIVE ULTRASOUND;  Surgeon: Hermina Staggers, MD;  Location: [facility];  Service: Gynecology;  Laterality: N/A;   WISDOM TOOTH EXTRACTION      Social History   Socioeconomic History   Marital status: Married    Spouse name: Not on file   Number of children: Not on file   Years of education: Not on file   Highest education level: Not on file  Occupational History   Not on file  Tobacco Use   Smoking status: Never   Smokeless tobacco: Never  Vaping Use    Vaping status: Not on file  Substance and Sexual Activity   Alcohol use: Not Currently   Drug use: Never   Sexual activity: Yes    Birth control/protection: None  Other Topics Concern   Not on file  Social History Narrative   Not on file   Social Determinants of Health   Financial Resource Strain: Low Risk  (11/20/2020)   Received from [facility], [facility]   Overall Financial Resource Strain (CARDIA)    Difficulty of Paying Living Expenses: Not hard at all  Food Insecurity: No Food Insecurity (11/20/2020)   Received from [facility], [facility]   Hunger Vital Sign    Worried About Running Out of Food in the Last Year: Never true    Ran Out of Food in the Last Year: Never true  Transportation Needs: No Transportation Needs (11/20/2020)   Received from [facility], [facility]   PRAPARE - Transportation    Lack of Transportation (Medical): No    Lack of Transportation (Non-Medical): No  Physical Activity: Insufficiently Active (11/20/2020)   Received from [facility], [facility]   Exercise Vital Sign    Days of Exercise per Week: 4 days    Minutes of Exercise per Session: 30 min  Stress: Not on file  Social Connections: Not on file  Family History  Problem Relation Age of Onset   Multiple sclerosis Mother    ADD / ADHD Sister    Heart defect Brother    Cancer Maternal Aunt    Ovarian cancer Maternal Aunt    Cancer Maternal Grandmother    Alzheimer's disease Paternal Grandmother    Stroke Paternal Grandmother     Health Maintenance  Topic Date Due   INFLUENZA VACCINE  04/17/2023   COVID-19 Vaccine (2 - 2023-24 season) 05/18/2023   Cervical Cancer Screening (HPV/Pap Cotest)  07/17/2025   DTaP/Tdap/Td (3 - Td or Tdap) 01/22/2028   Hepatitis C Screening  Completed   HIV Screening  Completed   HPV VACCINES  Aged Out      ----------------------------------------------------------------------------------------------------------------------------------------------------------------------------------------------------------------- Physical Exam BP 102/66 (BP Location: Left Arm, Patient Position: Sitting, Cuff Size: Normal)   Pulse 87   Ht 5\' 6"  (1.676 m)   Wt 179 lb (81.2 kg)   SpO2 99%   BMI 28.89 kg/m   Physical Exam Constitutional:      Appearance: Normal appearance.  Eyes:     General: No scleral icterus. Cardiovascular:     Rate and Rhythm: Normal rate and regular rhythm.  Pulmonary:     Effort: Pulmonary effort is normal.     Breath sounds: Normal breath sounds.  Musculoskeletal:     Cervical back: Neck supple.  Neurological:     Mental Status: She is alert.  Psychiatric:        Mood and Affect: Mood normal.        Behavior: Behavior normal.     ------------------------------------------------------------------------------------------------------------------------------------------------------------------------------------------------------------------- Assessment and Plan  Anxiety Doing well with fluoxetine at current strength.  She had been on sertraline in the past and felt that she did well with this, thinks it was switched due to some weight gain. She would be willing to change back to sertraline if needed when she becomes pregnant.  Hypothyroidism Feels good with current strength of levothyroxine.    ADHD We will with current medications.  We will plan to continue.   No orders of the defined types were placed in this encounter.   No follow-ups on file.    This visit occurred during the SARS-CoV-2 public health emergency.  Safety protocols were in place, including screening questions prior to the visit, additional usage of staff PPE, and extensive cleaning of exam room while observing appropriate contact time as indicated for disinfecting solutions.

## 2023-06-16 NOTE — Assessment & Plan Note (Signed)
Doing well with fluoxetine at current strength.  She had been on sertraline in the past and felt that she did well with this, thinks it was switched due to some weight gain. She would be willing to change back to sertraline if needed when she becomes pregnant.

## 2023-06-18 ENCOUNTER — Encounter: Payer: Self-pay | Admitting: Family Medicine

## 2023-06-19 MED ORDER — SERTRALINE HCL 25 MG PO TABS: 25.0000 mg | ORAL_TABLET | Freq: Every day | ORAL | 1 refills | Status: DC

## 2023-09-09 ENCOUNTER — Other Ambulatory Visit: Payer: Self-pay | Admitting: Family Medicine

## 2023-11-09 IMAGING — CT CT ABD-PELV W/ CM
2 of 4 series · 15 of 46 positions shown, 17 images · IV contrast (APPLIED)
Comparison: None Available.

CLINICAL DATA: Right lower quadrant abdominal pain with nausea
since this morning.

EXAM:
CT ABDOMEN AND PELVIS WITH CONTRAST
TECHNIQUE: Multidetector CT imaging of the abdomen and pelvis was performed
using the standard protocol following bolus administration of
intravenous contrast.

[Series 2: axial st · axial · 0.68mm/px · z∈[-466,-1]mm · 12 of 103 slices shown, 14 images]
[im 5/103  soft-tissue]
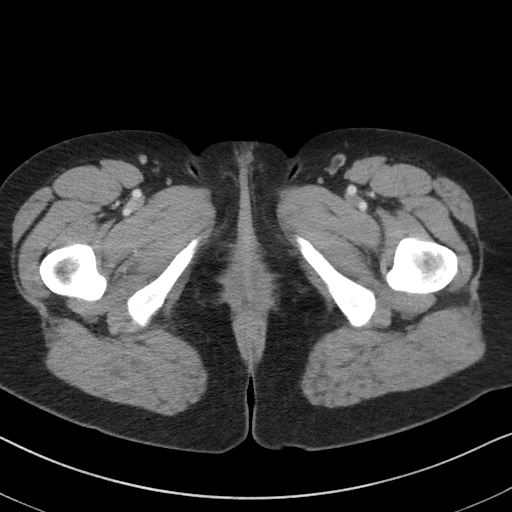
[im 5/103  bone]
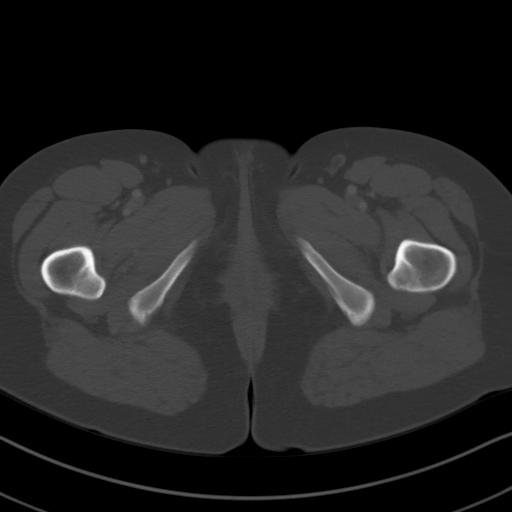
[im 13/103  soft-tissue]
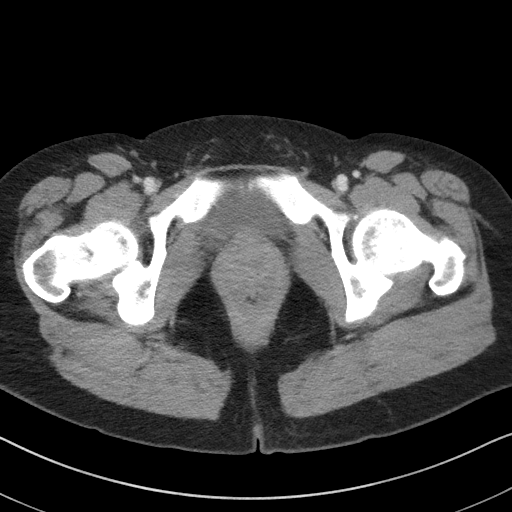
[im 22/103  soft-tissue]
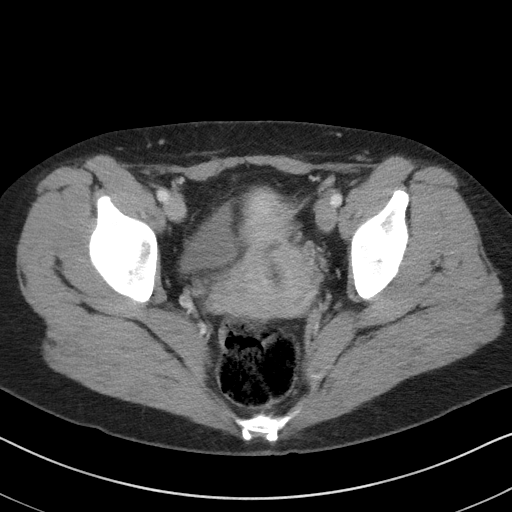
[im 30/103  soft-tissue]
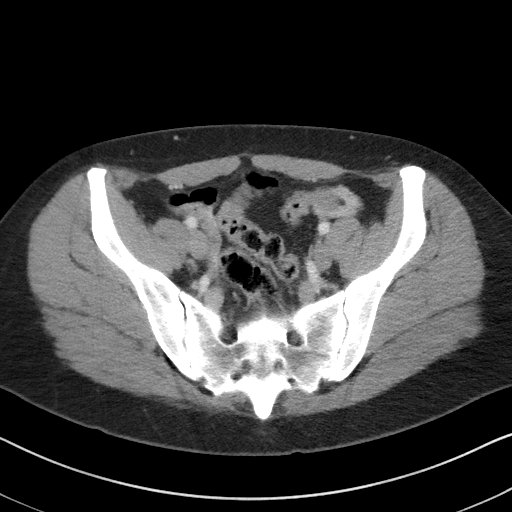
[im 39/103  soft-tissue]
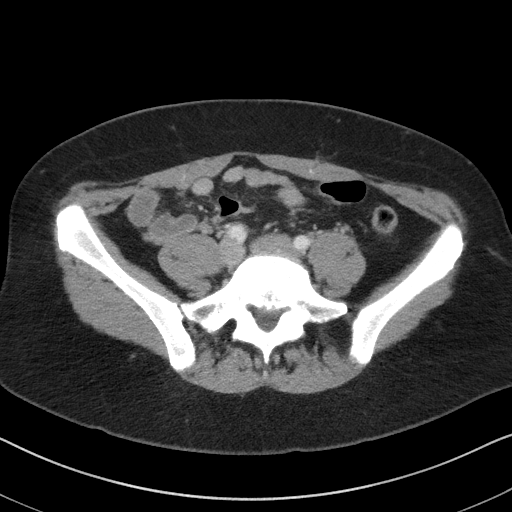
[im 47/103  soft-tissue]
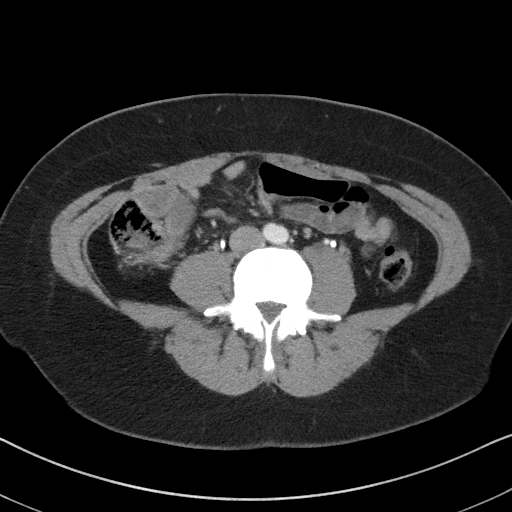
[im 56/103  soft-tissue]
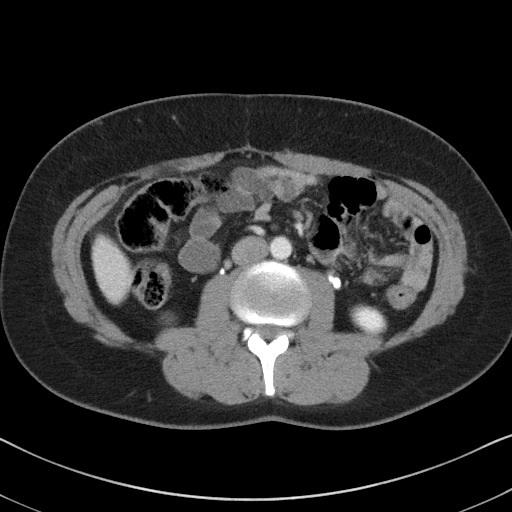
[im 64/103  soft-tissue]
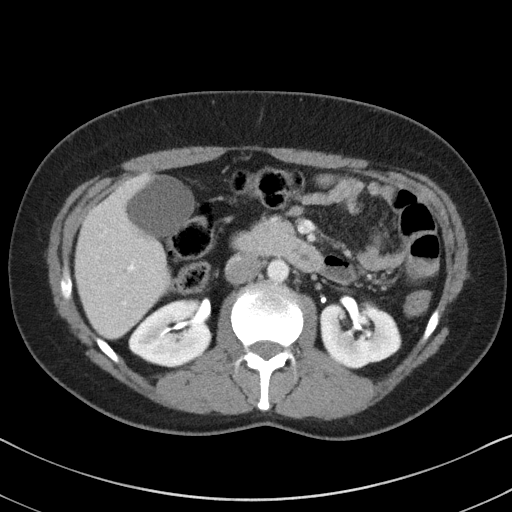
[im 73/103  soft-tissue]
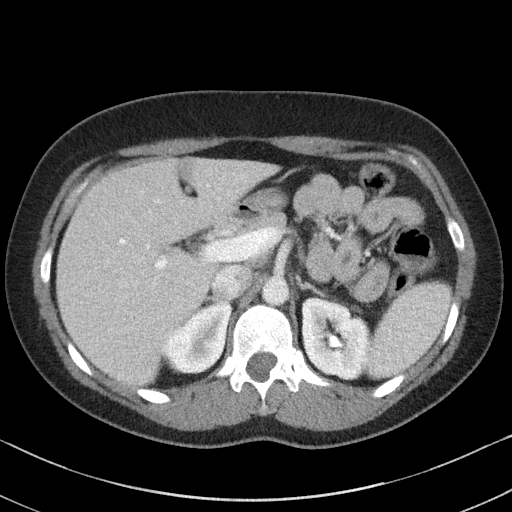
[im 73/103  bone]
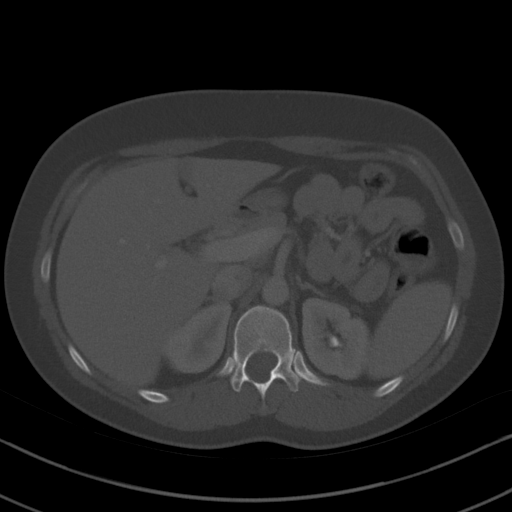
[im 81/103  soft-tissue]
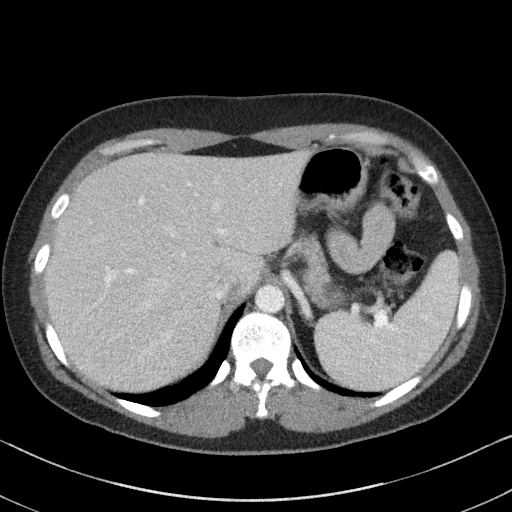
[im 90/103  soft-tissue]
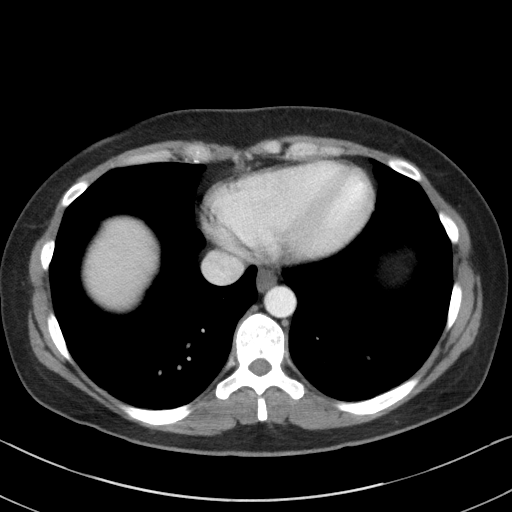
[im 98/103  soft-tissue]
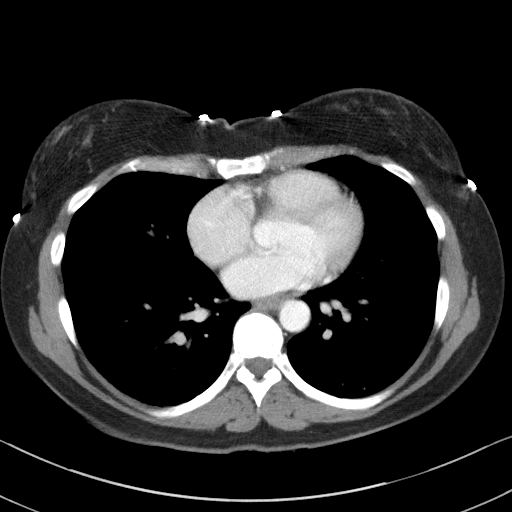

[Series 5: coronal st · coronal · 0.65mm/px · 3 of 72 slices shown]
[im 24/72  soft-tissue]
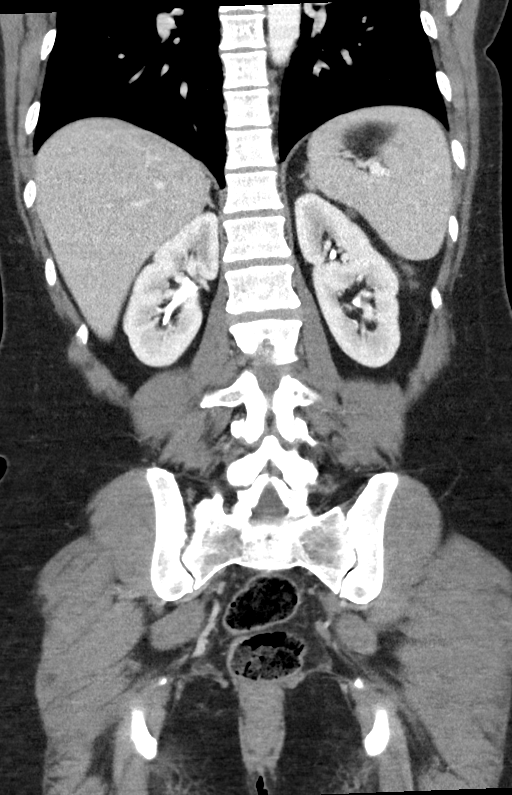
[im 32/72  soft-tissue]
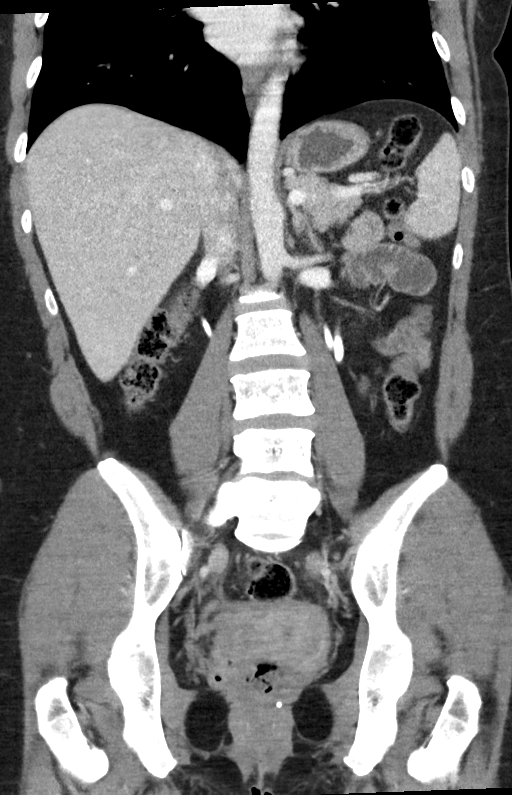
[im 40/72  soft-tissue]
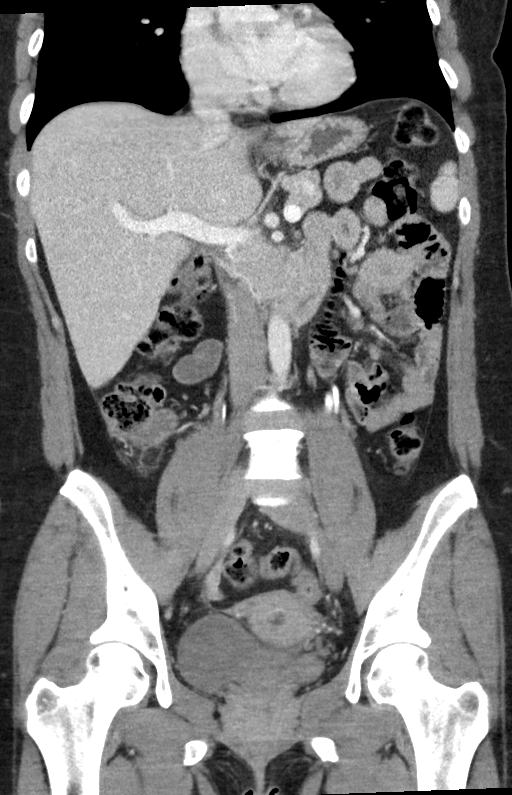

[15 of 46 positions shown; findings below may reference images not displayed]

RADIATION DOSE REDUCTION: This exam was performed according to the
departmental dose-optimization program which includes automated
exposure control, adjustment of the mA and/or kV according to
patient size and/or use of iterative reconstruction technique.

CONTRAST:  100mL OMNIPAQUE IOHEXOL 300 MG/ML  SOLN
FINDINGS: Lower chest: No acute abnormality.

Hepatobiliary: Wedge-shaped hypodense area along the falciform
ligament for instance on image [DATE] is in region commonly reflecting
focal fatty infiltration or altered perfusion (veins of [place]).
Gallbladder is unremarkable. No biliary ductal dilation.

Pancreas: No pancreatic ductal dilation or evidence of acute
inflammation.

Spleen: No splenomegaly or focal splenic lesion.

Adrenals/Urinary Tract: Bilateral adrenal glands appear normal. No
hydronephrosis. Kidneys demonstrate symmetric enhancement and
excretion of contrast material. At least partial duplication of the
left renal collecting system. Urinary bladder is unremarkable for
degree of distension.

Stomach/Bowel: No radiopaque enteric contrast material was
administered. Stomach is unremarkable for degree of distension. No
pathologic dilation of large or small bowel. The appendix is mildly
dilated with some adjacent inflammatory stranding measuring up to
7.5 mm in diameter on sagittal image 39/6 with adjacent
inflammation. No appendicoliths identified.

Vascular/Lymphatic: Normal caliber abdominal aorta. No
pathologically enlarged abdominal or pelvic lymph nodes.

Reproductive: Pelvic reproductive structures are normal in CT
appearance for a reproductive age female.

Other: No walled off fluid collections. Mild stranding in the right
lower quadrant. No pneumoperitoneum.

Musculoskeletal: No acute osseous abnormality.
IMPRESSION: CT findings most consistent with early acute uncomplicated
appendicitis.

## 2023-12-13 ENCOUNTER — Other Ambulatory Visit: Payer: Self-pay | Admitting: Family Medicine

## 2023-12-15 ENCOUNTER — Ambulatory Visit: Payer: 59 | Admitting: Family Medicine

## 2023-12-15 ENCOUNTER — Other Ambulatory Visit: Payer: Self-pay | Admitting: Family Medicine

## 2024-06-19 ENCOUNTER — Other Ambulatory Visit: Payer: Self-pay | Admitting: Family Medicine

## 2024-06-19 DIAGNOSIS — E039 Hypothyroidism, unspecified: Secondary | ICD-10-CM

## 2024-06-21 NOTE — Telephone Encounter (Signed)
 Pls contact the pt to schedule appt for ADHD and fasting labs. Last seen 2024. Sending 30 day refill. Thx.

## 2024-07-14 ENCOUNTER — Other Ambulatory Visit: Payer: Self-pay | Admitting: Family Medicine

## 2024-07-14 DIAGNOSIS — E039 Hypothyroidism, unspecified: Secondary | ICD-10-CM

## 2024-07-14 NOTE — Telephone Encounter (Signed)
 Pt requires appt with Dr. Alvia for medication refills. Thanks

## 2024-07-18 ENCOUNTER — Other Ambulatory Visit: Payer: Self-pay | Admitting: Family Medicine

## 2024-07-18 DIAGNOSIS — E039 Hypothyroidism, unspecified: Secondary | ICD-10-CM

## 2024-09-02 ENCOUNTER — Ambulatory Visit: Admitting: Dermatology

## 2024-09-02 ENCOUNTER — Encounter: Payer: Self-pay | Admitting: Family Medicine

## 2024-09-02 ENCOUNTER — Encounter: Payer: Self-pay | Admitting: Dermatology

## 2024-09-02 ENCOUNTER — Ambulatory Visit (INDEPENDENT_AMBULATORY_CARE_PROVIDER_SITE_OTHER): Admitting: Family Medicine

## 2024-09-02 VITALS — BP 120/71 | HR 90 | Temp 98.5°F

## 2024-09-02 VITALS — BP 114/80 | HR 67 | Temp 98.2°F | Resp 18 | Ht 69.0 in | Wt 190.1 lb

## 2024-09-02 DIAGNOSIS — L570 Actinic keratosis: Secondary | ICD-10-CM

## 2024-09-02 DIAGNOSIS — W908XXA Exposure to other nonionizing radiation, initial encounter: Secondary | ICD-10-CM

## 2024-09-02 DIAGNOSIS — E039 Hypothyroidism, unspecified: Secondary | ICD-10-CM

## 2024-09-02 DIAGNOSIS — L910 Hypertrophic scar: Secondary | ICD-10-CM

## 2024-09-02 DIAGNOSIS — L905 Scar conditions and fibrosis of skin: Secondary | ICD-10-CM | POA: Diagnosis not present

## 2024-09-02 DIAGNOSIS — L578 Other skin changes due to chronic exposure to nonionizing radiation: Secondary | ICD-10-CM | POA: Diagnosis not present

## 2024-09-02 DIAGNOSIS — D229 Melanocytic nevi, unspecified: Secondary | ICD-10-CM

## 2024-09-02 DIAGNOSIS — F419 Anxiety disorder, unspecified: Secondary | ICD-10-CM

## 2024-09-02 MED ORDER — SERTRALINE HCL 25 MG PO TABS: 25.0000 mg | ORAL_TABLET | Freq: Every day | ORAL | 3 refills | Status: AC

## 2024-09-02 NOTE — Assessment & Plan Note (Signed)
 Doing well with sertraline  at current strength will plant to continue.  F/u in 6 months.

## 2024-09-02 NOTE — Progress Notes (Signed)
 Taylor Yu - 38 y.o. female MRN [id number]  Date of birth: 1986/01/12  Subjective Chief Complaint  Patient presents with   Medication Refill    HPI Taylor Yu is a 38 y.o. female here today for follow up visit.   Reports that she is doing well.  Seen in ED for epistaxis but this has resolved and not re-occurred.    She continues to do well with sertraline .  She is 6 months post partum.  Denies increasing symptoms of depression and anxiety. She does have some foggy thinking with being off of Adderall , which she is holding due to breast feeding.  Energy levels are up and down and she feels hungry more often than she has.  She attributes this to increased mild production.    ROS:  A comprehensive ROS was completed and negative except as noted per HPI  Allergies[1]  Past Medical History:  Diagnosis Date   ADHD    Gestational diabetes    History of COVID-19 03/28/2021   Polyhydramnios affecting pregnancy    Vaginal Pap smear, abnormal     Past Surgical History:  Procedure Laterality Date   BREAST REDUCTION SURGERY     CESAREAN SECTION     COLPOSCOPY     DILATION AND EVACUATION N/A 06/01/2021   Procedure: DILATATION AND EVACUATION;  Surgeon: Lorence Ozell CROME, MD;  Location: [facility];  Service: Gynecology;  Laterality: N/A;   LAPAROSCOPIC APPENDECTOMY N/A 01/16/2022   Procedure: APPENDECTOMY LAPAROSCOPIC;  Surgeon: Dasie Leonor CROME, MD;  Location: [facility];  Service: General;  Laterality: N/A;   LEEP     OPERATIVE ULTRASOUND N/A 06/01/2021   Procedure: OPERATIVE ULTRASOUND;  Surgeon: Lorence Ozell CROME, MD;  Location: [facility];  Service: Gynecology;  Laterality: N/A;   WISDOM TOOTH EXTRACTION      Social History   Socioeconomic History   Marital status: Married    Spouse name: Not on file   Number of children: Not on file   Years of education: Not on file   Highest education level: Not on file  Occupational History   Not on file  Tobacco Use    Smoking status: Never   Smokeless tobacco: Never  Vaping Use   Vaping status: Not on file  Substance and Sexual Activity   Alcohol use: Not Currently   Drug use: Never   Sexual activity: Yes    Birth control/protection: None  Other Topics Concern   Not on file  Social History Narrative   Not on file   Social Drivers of Health   Tobacco Use: Low Risk (09/02/2024)   Patient History    Smoking Tobacco Use: Never    Smokeless Tobacco Use: Never    Passive Exposure: Not on file  Financial Resource Strain: Low Risk (09/23/2023)   Received from [facility]   Overall Financial Resource Strain (CARDIA)    Difficulty of Paying Living Expenses: Not hard at all  Food Insecurity: No Food Insecurity (03/03/2024)   Received from [facility]   Epic    Within the past 12 months, you worried that your food would run out before you got the money to buy more.: Never true    Within the past 12 months, the food you bought just didn't last and you didn't have money to get more.: Never true  Transportation Needs: No Transportation Needs (03/03/2024)   Received from [facility] - [facility]    Lack of Transportation (Medical): No  Lack of Transportation (Non-Medical): No  Physical Activity: Not on file  Stress: No Stress Concern Present (03/03/2024)   Received from [facility] - Occupational Stress Questionnaire    Feeling of Stress : Not at all  Social Connections: Not on file  Depression (PHQ2-9): Low Risk (09/02/2024)   Depression (PHQ2-9)    PHQ-2 Score: 3  Alcohol Screen: Not on file  Housing: Low Risk (03/03/2024)   Received from [facility]    In the last 12 months, was there a time when you were not able to pay the mortgage or rent on time?: No    In the past 12 months, how many times have you moved where you were living?: 0    At any time in the past 12 months, were you homeless or living in a shelter  (including now)?: No  Utilities: Not At Risk (03/03/2024)   Received from [facility]    Threatened with loss of utilities: No  Health Literacy: Not on file    Family History  Problem Relation Age of Onset   Multiple sclerosis Mother    ADD / ADHD Sister    Heart defect Brother    Cancer Maternal Aunt    Ovarian cancer Maternal Aunt    Cancer Maternal Grandmother    Alzheimer's disease Paternal Grandmother    Stroke Paternal Grandmother     Health Maintenance  Topic Date Due   Hepatitis B Vaccines 19-59 Average Risk (1 of 3 - 19+ 3-dose series) Never done   HPV VACCINES (1 - Risk 3-dose SCDM series) Never done   COVID-19 Vaccine (2 - Pfizer risk series) 07/30/2021   Influenza Vaccine  04/16/2024   Cervical Cancer Screening (HPV/Pap Cotest)  07/17/2025   DTaP/Tdap/Td (3 - Td or Tdap) 01/22/2028   Hepatitis C Screening  Completed   HIV Screening  Completed   Pneumococcal Vaccine  Aged Out   Meningococcal B Vaccine  Aged Out     ----------------------------------------------------------------------------------------------------------------------------------------------------------------------------------------------------------------- Physical Exam BP 114/80 (BP Location: Left Arm, Patient Position: Sitting, Cuff Size: Normal)   Pulse 67   Temp 98.2 F (36.8 C) (Oral)   Resp 18   Ht 5' 9 (1.753 m)   Wt 190 lb 1.3 oz (86.2 kg)   SpO2 97%   BMI 28.07 kg/m   Physical Exam Constitutional:      Appearance: Normal appearance.  Eyes:     General: No scleral icterus. Musculoskeletal:     Cervical back: Neck supple.  Neurological:     Mental Status: She is alert.  Psychiatric:        Mood and Affect: Mood normal.        Behavior: Behavior normal.      ------------------------------------------------------------------------------------------------------------------------------------------------------------------------------------------------------------------- Assessment and Plan  Hypothyroidism Reports having some fatigue and increased appetite.  Possibly related to breast feeding.  She will try adding prenatal and vitamin d back on. Recheck TSH if not improving.   Anxiety Doing well with sertraline  at current strength will plant to continue.  F/u in 6 months.    Meds ordered this encounter  Medications   sertraline  (ZOLOFT ) 25 MG tablet    Sig: Take 1 tablet (25 mg total) by mouth daily.    Dispense:  90 tablet    Refill:  3    Return in about 6 months (around 03/03/2025) for Mood/BH.        [1]  Allergies Allergen Reactions  Meperidine  Itching and Rash   Meperidine  Hcl Hives

## 2024-09-02 NOTE — Addendum Note (Signed)
 Addended by: Desiray Orchard Z on: 09/02/2024 04:29 PM   Modules accepted: Orders

## 2024-09-02 NOTE — Patient Instructions (Signed)

## 2024-09-02 NOTE — Progress Notes (Signed)
° °  New Patient Visit    History of Present Illness Taylor Yu is a 38 year old female who presents with concerns about skin changes post-pregnancy.  She has noticed a couple of spots on her abdomen that became more prominent during her recent pregnancy. Her son was born six months ago, and she has had two other pregnancies, including one miscarriage. No history of skin cancer and no family history of melanoma.  She has a history of tanning during her teens and early twenties but has since become more conscious about sun protection, using sunscreen daily with SPF 30 or more.  Additionally, she inquires about a cosmetic concern related to her C-section scar, noting that the area 'kind of like tucks in.' She had C-sections with both children, with the first being an emergency and the second planned due to the anticipated size of the baby.  The patient has spots, moles and lesions to be evaluated, some may be new or changing and the patient may have concern these could be cancer.  The following portions of the chart were reviewed this encounter and updated as appropriate: medications, allergies, medical history  Review of Systems:  No other skin or systemic complaints except as noted in HPI or Assessment and Plan.  Objective  Well appearing patient in no apparent distress; mood and affect are within normal limits.  A focused examination was performed of the following areas: ABDOMEN and ARMS  Relevant exam findings are noted in the Assessment and Plan.     Assessment & Plan   SCAR - POST C SECTION  Exam: Dyspigmented smooth macule or patch. Benign-appearing.  Observation.  Call clinic for new or changing lesions. Recommend daily broad spectrum sunscreen SPF 30+, reapply every 2 hours as needed. Treatment: Discussed referral to plastics for consult for scar revision  MELANOCYTIC NEVI Exam: Tan-brown and/or pink-flesh-colored symmetric macules and papules  Treatment Plan:  Benign appearing on exam today. Recommend observation. Call clinic for new or changing moles. Recommend daily use of broad spectrum spf 30+ sunscreen to sun-exposed areas.   ACTINIC DAMAGE - chronic, secondary to cumulative UV radiation exposure/sun exposure over time - diffuse scaly erythematous macules with underlying dyspigmentation - Recommend daily broad spectrum sunscreen SPF 30+ to sun-exposed areas, reapply every 2 hours as needed.  - Recommend staying in the shade or wearing long sleeves, sun glasses (UVA+UVB protection) and wide brim hats (4-inch brim around the entire circumference of the hat).  Return in about 6 months (around 03/03/2025) for TBSE asap with Erminio.  I, Doyce Pan, CMA, am acting as scribe for RUFUS CHRISTELLA HOLY, MD.   Documentation: I have reviewed the above documentation for accuracy and completeness, and I agree with the above.  RUFUS CHRISTELLA HOLY, MD

## 2024-09-02 NOTE — Assessment & Plan Note (Signed)
 Reports having some fatigue and increased appetite.  Possibly related to breast feeding.  She will try adding prenatal and vitamin d back on. Recheck TSH if not improving.

## 2025-03-07 ENCOUNTER — Ambulatory Visit: Admitting: Family Medicine

## 2025-03-15 ENCOUNTER — Ambulatory Visit: Admitting: Physician Assistant
